# Patient Record
Sex: Male | Born: 1940
Health system: Southern US, Community
[De-identification: ages and names within clinical notes are randomized; demographics above are authoritative.]

## PROBLEM LIST (undated history)

## (undated) DIAGNOSIS — H9190 Unspecified hearing loss, unspecified ear: Secondary | ICD-10-CM

## (undated) DIAGNOSIS — E7800 Pure hypercholesterolemia, unspecified: Secondary | ICD-10-CM

## (undated) DIAGNOSIS — R52 Pain, unspecified: Secondary | ICD-10-CM

## (undated) DIAGNOSIS — C61 Malignant neoplasm of prostate: Secondary | ICD-10-CM

## (undated) DIAGNOSIS — E039 Hypothyroidism, unspecified: Secondary | ICD-10-CM

## (undated) DIAGNOSIS — C801 Malignant (primary) neoplasm, unspecified: Secondary | ICD-10-CM

## (undated) DIAGNOSIS — H53419 Scotoma involving central area, unspecified eye: Secondary | ICD-10-CM

## (undated) DIAGNOSIS — I10 Essential (primary) hypertension: Secondary | ICD-10-CM

## (undated) HISTORY — PX: HAND SURGERY: SHX662

## (undated) HISTORY — PX: OTHER SURGICAL HISTORY: SHX169

---

## 1961-12-05 HISTORY — PX: APPENDECTOMY: SHX54

## 2000-11-02 ENCOUNTER — Ambulatory Visit (HOSPITAL_COMMUNITY): Admission: RE | Admit: 2000-11-02 | Discharge: 2000-11-02 | Payer: Self-pay | Admitting: Orthopedic Surgery

## 2000-11-02 ENCOUNTER — Encounter: Payer: Self-pay | Admitting: Orthopedic Surgery

## 2014-01-08 DIAGNOSIS — C61 Malignant neoplasm of prostate: Secondary | ICD-10-CM

## 2014-01-08 HISTORY — DX: Malignant neoplasm of prostate: C61

## 2014-02-06 ENCOUNTER — Other Ambulatory Visit: Payer: Self-pay | Admitting: Urology

## 2014-02-12 ENCOUNTER — Encounter (HOSPITAL_COMMUNITY): Payer: Self-pay | Admitting: Pharmacy Technician

## 2014-02-14 ENCOUNTER — Encounter (HOSPITAL_COMMUNITY): Payer: Self-pay

## 2014-02-14 ENCOUNTER — Ambulatory Visit (HOSPITAL_COMMUNITY)
Admission: RE | Admit: 2014-02-14 | Discharge: 2014-02-14 | Disposition: A | Discharge status: Home / Self Care | Payer: Medicare FFS | Source: Ambulatory Visit | Attending: Urology | Admitting: Urology

## 2014-02-14 ENCOUNTER — Encounter (INDEPENDENT_AMBULATORY_CARE_PROVIDER_SITE_OTHER): Payer: Self-pay

## 2014-02-14 ENCOUNTER — Encounter (HOSPITAL_COMMUNITY)
Admission: RE | Admit: 2014-02-14 | Discharge: 2014-02-14 | Disposition: A | Discharge status: Home / Self Care | Payer: Medicare FFS | Source: Ambulatory Visit | Attending: Urology | Admitting: Urology

## 2014-02-14 DIAGNOSIS — Z01812 Encounter for preprocedural laboratory examination: Secondary | ICD-10-CM | POA: Insufficient documentation

## 2014-02-14 DIAGNOSIS — I10 Essential (primary) hypertension: Secondary | ICD-10-CM | POA: Insufficient documentation

## 2014-02-14 DIAGNOSIS — Z01818 Encounter for other preprocedural examination: Secondary | ICD-10-CM | POA: Insufficient documentation

## 2014-02-14 DIAGNOSIS — Z87891 Personal history of nicotine dependence: Secondary | ICD-10-CM | POA: Insufficient documentation

## 2014-02-14 DIAGNOSIS — Z01810 Encounter for preprocedural cardiovascular examination: Secondary | ICD-10-CM | POA: Insufficient documentation

## 2014-02-14 HISTORY — DX: Essential (primary) hypertension: I10

## 2014-02-14 HISTORY — DX: Malignant (primary) neoplasm, unspecified: C80.1

## 2014-02-14 HISTORY — DX: Scotoma involving central area, unspecified eye: H53.419

## 2014-02-14 HISTORY — DX: Pain, unspecified: R52

## 2014-02-14 HISTORY — DX: Hypothyroidism, unspecified: E03.9

## 2014-02-14 HISTORY — DX: Pure hypercholesterolemia, unspecified: E78.00

## 2014-02-14 HISTORY — DX: Unspecified hearing loss, unspecified ear: H91.90

## 2014-02-14 LAB — BASIC METABOLIC PANEL
BUN: 17 mg/dL (ref 6–23)
CALCIUM: 9.5 mg/dL (ref 8.4–10.5)
CHLORIDE: 102 meq/L (ref 96–112)
CO2: 25 mEq/L (ref 19–32)
CREATININE: 1.05 mg/dL (ref 0.50–1.35)
GFR calc non Af Amer: 69 mL/min — ABNORMAL LOW (ref >90)
GFR, EST AFRICAN AMERICAN: 80 mL/min — AB (ref >90)
Glucose, Bld: 91 mg/dL (ref 70–99)
Potassium: 4 mEq/L (ref 3.7–5.3)
Sodium: 140 mEq/L (ref 137–147)

## 2014-02-14 LAB — CBC
HEMATOCRIT: 39.2 % (ref 39.0–52.0)
Hemoglobin: 13.4 g/dL (ref 13.0–17.0)
MCH: 29.3 pg (ref 26.0–34.0)
MCHC: 34.2 g/dL (ref 30.0–36.0)
MCV: 85.8 fL (ref 78.0–100.0)
PLATELETS: 190 10*3/uL (ref 150–400)
RBC: 4.57 MIL/uL (ref 4.22–5.81)
RDW: 13.4 % (ref 11.5–15.5)
WBC: 7.2 10*3/uL (ref 4.0–10.5)

## 2014-02-14 LAB — ABO/RH: ABO/RH(D): A POS

## 2014-02-14 NOTE — Progress Notes (Signed)
02/14/14 0900  OBSTRUCTIVE SLEEP APNEA  Have you ever been diagnosed with sleep apnea through a sleep study? No  Do you snore loudly (loud enough to be heard through closed doors)?  1  Do you often feel tired, fatigued, or sleepy during the daytime? 0  Has anyone observed you stop breathing during your sleep? 0  Do you have, or are you being treated for high blood pressure? 1  BMI more than 35 kg/m2? 0  Age over 72 years old? 1  Neck circumference greater than 40 cm/18 inches? 1  Gender: 1  Obstructive Sleep Apnea Score 5  Score 4 or greater  Results sent to PCP

## 2014-02-14 NOTE — Pre-Procedure Instructions (Signed)
EKG AND CXR WERE DONE TODAY - PREOP AT [facility]. 

## 2014-02-14 NOTE — Patient Instructions (Signed)
FOLLOW YOUR BOWEL PREP INSTRUCTIONS DAY BEFORE YOUR SURGERY.  YOUR SURGERY IS SCHEDULED AT [facility]  ON:   Thursday  3/19  REPORT TO  SHORT STAY CENTER AT:  5:15 AM      PHONE # FOR SHORT STAY IS [phone]  DO NOT EAT OR DRINK ANYTHING AFTER MIDNIGHT THE NIGHT BEFORE YOUR SURGERY.  YOU MAY BRUSH YOUR TEETH, RINSE OUT YOUR MOUTH--BUT NO WATER, NO FOOD, NO CHEWING GUM, NO MINTS, NO CANDIES, NO CHEWING TOBACCO.  PLEASE TAKE THE FOLLOWING MEDICATIONS THE AM OF YOUR SURGERY WITH A FEW SIPS OF WATER:  LEVOTHYROXINE  DO NOT BRING VALUABLES, MONEY, CREDIT CARDS.  DO NOT WEAR JEWELRY, MAKE-UP, NAIL POLISH AND NO METAL PINS OR CLIPS IN YOUR HAIR. CONTACT LENS, DENTURES / PARTIALS, GLASSES SHOULD NOT BE WORN TO SURGERY AND IN MOST CASES-HEARING AIDS WILL NEED TO BE REMOVED.  BRING YOUR GLASSES CASE, ANY EQUIPMENT NEEDED FOR YOUR CONTACT LENS. FOR PATIENTS ADMITTED TO THE HOSPITAL--CHECK OUT TIME THE DAY OF DISCHARGE IS 11:00 AM.  ALL INPATIENT ROOMS ARE PRIVATE - WITH BATHROOM, TELEPHONE, TELEVISION AND WIFI INTERNET.                                                    PLEASE READ OVER ANY  FACT SHEETS THAT YOU WERE GIVEN:  INCENTIVE SPIROMETER INFORMATION.  FAILURE TO FOLLOW THESE INSTRUCTIONS MAY RESULT IN THE CANCELLATION OF YOUR SURGERY. PLEASE BE AWARE THAT YOU MAY NEED ADDITIONAL BLOOD DRAWN DAY OF YOUR SURGERY  PATIENT SIGNATURE_________________________________

## 2014-02-19 ENCOUNTER — Encounter (HOSPITAL_COMMUNITY): Payer: Self-pay | Admitting: Anesthesiology

## 2014-02-19 NOTE — H&P (Signed)
Chief Complaint Prostate Cancer   Reason For Visit Reason for consult: To discuss treatment options for prostate cancer and specifically to consider a robotic prostatectomy.  Physician requesting consult: Dr. Franchot Gallo  PCP: Dr. Antony Contras   History of Present Illness Douglas Nielsen is a 73 year old gentleman who was noted to have an elevated PSA of 5.12. This prompted urologic evaluation and a prostate needle biopsy on 01/08/14 confirming Gleason 4+3=7 adenocarcinoma of the prostate with 5 out of 12 biopsy cores. He does have a paternal family history of prostate cancer. He is exceedingly healthy aside from hypertension, dyslipidemia, and hypothyroidism.    TNM stage: cT1c Nx Mx  PSA: 5.12  Gleason score: 4+3=7  Biopsy (01/08/14): 5/12 cores positive    Left: L lateral apex (5%, 3+3=6), L lateral base (5%, 3+3=6)    Right: R apex (< 5%, 3+3=6), R base (80%, 4+3=7), R lateral base (10%, 4+3=7)  Prostate volume: 24.5 cc    Nomogram  OC disease: 47%  EPE: 47%  SVI: 11%  LNI: 11%  PFS (surgery): 70% at 5 years, 55% at 10 years    Urinary function: He has minimal lower urinary tract symptoms. IPSS is 3.  Erectile function: He had his wife are not currently sexually active. He does have pre-existing erectile dysfunction. SHIM score is 5.   Past Medical History Problems  1. History of hypercholesterolemia (V12.29) 2. History of hypertension (V12.59) 3. History of hypothyroidism (V12.29)  Surgical History Problems  1. History of Appendectomy 2. History of Hand Surgery  Current Meds 1. Daily Multiple Vitamins TABS;  Therapy: (Recorded:04Mar2015) to Recorded 2. Fish Oil CAPS;  Therapy: (Recorded:04Mar2015) to Recorded 3. Levothyroxine Sodium 150 MCG Oral Tablet;  Therapy: (Recorded:20Jul2012) to Recorded 4. Levothyroxine Sodium 175 MCG Oral Tablet;  Therapy: 06Sep2013 to Recorded 5. Losartan Potassium 25 MG Oral Tablet;  Therapy: (FXTKWIOX:[id number])  to Recorded 6. Simvastatin 40 MG Oral Tablet;  Therapy: (Recorded:20Jul2012) to Recorded  Allergies Medication  1. No Known Drug Allergies  Family History Problems  1. Family history of Family Health Status Number Of Children   2 daughters 2. Family history of Father Deceased At Age 55   CHF 3. Family history of Heart Disease (V17.49) : Father 4. Family history of Hypothyroidism : Sister 42. Family history of Mother Deceased At Age 101   multiple myeloma 6. Family history of Multiple Myeloma : Mother 7. Family history of Prostate Cancer (Q68.34) : Father  Social History Problems    Alcohol Use   3 per day   Former smoker (V15.82)   1 ppd x 4yrs, quit 11yrs ago   Marital History - Currently Married   Occupation: Retired  Review of Systems  Genitourinary: no hematuria.  Constitutional: no recent weight loss.    Vitals Vital Signs [Data Includes: Last 1 Day]  Recorded: [id number] 10:11AM  Blood Pressure: 166 / 84 Heart Rate: 64  Physical Exam Constitutional: Well nourished and well developed . No acute distress.  ENT:. The ears and nose are normal in appearance.  Neck: The appearance of the neck is normal and no neck mass is present.  Pulmonary: No respiratory distress, normal respiratory rhythm and effort and clear bilateral breath sounds.  Cardiovascular: Heart rate and rhythm are normal . No peripheral edema.  Abdomen: right lower quadrant incision site(s) well healed. The abdomen is soft and nontender. No masses are palpated. No CVA tenderness. No hernias are palpable. No hepatosplenomegaly noted.  Rectal: Prostate size is estimated to  be 30 g. The prostate has no nodularity and is not indurated.  Genitourinary: Examination of the penis demonstrates no discharge, no masses, no lesions and a normal meatus. The scrotum is without lesions. The right epididymis is palpably normal and non-tender. The left epididymis is palpably normal and non-tender. The right  testis is non-tender and without masses. The left testis is non-tender and without masses.  Lymphatics: The femoral and inguinal nodes are not enlarged or tender.  Skin: Normal skin turgor, no visible rash and no visible skin lesions.  Neuro/Psych:. Mood and affect are appropriate.    Results/Data Urine [Data Includes: Last 1 Day]   [id number]  COLOR YELLOW   APPEARANCE CLEAR   SPECIFIC GRAVITY 1.020   pH 6.0   GLUCOSE NEG mg/dL  BILIRUBIN NEG   KETONE NEG mg/dL  BLOOD NEG   PROTEIN NEG mg/dL  UROBILINOGEN 0.2 mg/dL  NITRITE NEG   LEUKOCYTE ESTERASE NEG    I have reviewed his medical records, PSA results, and pathology report. Findings are as dictated above.   Plan  Adenocarcinoma of prostate  1. Follow-up Keep Future Appt Office  Follow-up  Status: Complete  Done: [id number] Health Maintenance  2. UA With REFLEX; [Do Not Release]; Status:Complete;   Done: [id number] 10:02AM  Adenocarcinoma of prostate (185) (C61)   Discussion/Summary 1. Prostate cancer: I had a detailed discussion with Mr. Kall and his wife today regarding his treatment options for prostate cancer. He has elected to proceed with surgical treatment.   The patient was counseled about the natural history of prostate cancer and the standard treatment options that are available for prostate cancer. It was explained to him how his age and life expectancy, clinical stage, Gleason score, and PSA affect his prognosis, the decision to proceed with additional staging studies, as well as how that information influences recommended treatment strategies. We discussed the roles for active surveillance, radiation therapy, surgical therapy, androgen deprivation, as well as ablative therapy options for the treatment of prostate cancer as appropriate to his individual cancer situation. We discussed the risks and benefits of these options with regard to their impact on cancer control and also in terms of potential adverse events,  complications, and impact on quiality of life particularly related to urinary, bowel, and sexual function. The patient was encouraged to ask questions throughout the discussion today and all questions were answered to his stated satisfaction. In addition, the patient was provided with and/or directed to appropriate resources and literature for further education about prostate cancer and treatment options.   We discussed surgical therapy for prostate cancer including the different available surgical approaches. We discussed, in detail, the risks and expectations of surgery with regard to cancer control, urinary control, and erectile function as well as the expected postoperative recovery process. Additional risks of surgery including but not limited to bleeding, infection, hernia formation, nerve damage, lymphocele formation, bowel/rectal injury potentially necessitating colostomy, damage to the urinary tract resulting in urine leakage, urethral stricture, and the cardiopulmonary risks such as myocardial infarction, stroke, death, venothromboembolism, etc. were explained. The risk of open surgical conversion for robotic/laparoscopic prostatectomy was also discussed.     He feels well informed and is ready to proceed with surgery. He will be scheduled for a bilateral nerve sparing (although attention will be paid to the right side of the prostate) robotic-assisted left scopic radical prostatectomy and bilateral pelvic lymphadenectomy.    Cc: Dr. Franchot Gallo  Dr. Antony Contras    SignaturesElectronically signed by :  Raynelle Bring, M.D.; Feb 14 2014 11:31AM EST

## 2014-02-19 NOTE — Anesthesia Preprocedure Evaluation (Addendum)
Anesthesia Evaluation  Patient identified by MRN, date of birth, ID band Patient awake    Reviewed: Allergy & Precautions, H&P , NPO status , Patient's Chart, lab work & pertinent test results  Airway Mallampati: II TM Distance: >3 FB Neck ROM: Full    Dental no notable dental hx.    Pulmonary former smoker,  He says he quit smoking in 1987 breath sounds clear to auscultation  Pulmonary exam normal       Cardiovascular hypertension, Pt. on medications Rhythm:Regular Rate:Normal  CXR and ECG reviewed.   Neuro/Psych Loss of some vision in right eye. negative psych ROS   GI/Hepatic negative GI ROS, Neg liver ROS,   Endo/Other  Hypothyroidism   Renal/GU negative Renal ROS  negative genitourinary   Musculoskeletal negative musculoskeletal ROS (+)   Abdominal (+) + obese,   Peds negative pediatric ROS (+)  Hematology negative hematology ROS (+)   Anesthesia Other Findings   Reproductive/Obstetrics negative OB ROS                        Anesthesia Physical Anesthesia Plan  ASA: II  Anesthesia Plan: General   Post-op Pain Management:    Induction: Intravenous  Airway Management Planned: Oral ETT  Additional Equipment:   Intra-op Plan:   Post-operative Plan: Extubation in OR  Informed Consent: I have reviewed the patients History and Physical, chart, labs and discussed the procedure including the risks, benefits and alternatives for the proposed anesthesia with the patient or authorized representative who has indicated his/her understanding and acceptance.   Dental advisory given  Plan Discussed with: CRNA  Anesthesia Plan Comments:         Anesthesia Quick Evaluation

## 2014-02-20 ENCOUNTER — Encounter (HOSPITAL_COMMUNITY): Payer: Medicare FFS | Admitting: Anesthesiology

## 2014-02-20 ENCOUNTER — Encounter (HOSPITAL_COMMUNITY): Payer: Self-pay | Admitting: *Deleted

## 2014-02-20 ENCOUNTER — Inpatient Hospital Stay (HOSPITAL_COMMUNITY): Payer: Medicare FFS | Admitting: Anesthesiology

## 2014-02-20 ENCOUNTER — Encounter (HOSPITAL_COMMUNITY)
Admission: RE | Disposition: A | Discharge status: Home / Self Care | Payer: Self-pay | Source: Ambulatory Visit | Attending: Urology

## 2014-02-20 ENCOUNTER — Inpatient Hospital Stay (HOSPITAL_COMMUNITY)
Admission: RE | Admit: 2014-02-20 | Discharge: 2014-02-21 | DRG: 708 | Disposition: A | Discharge status: Home / Self Care | Payer: Medicare FFS | Source: Ambulatory Visit | Attending: Urology | Admitting: Urology

## 2014-02-20 DIAGNOSIS — Z87891 Personal history of nicotine dependence: Secondary | ICD-10-CM

## 2014-02-20 DIAGNOSIS — E785 Hyperlipidemia, unspecified: Secondary | ICD-10-CM | POA: Diagnosis present

## 2014-02-20 DIAGNOSIS — Z79899 Other long term (current) drug therapy: Secondary | ICD-10-CM

## 2014-02-20 DIAGNOSIS — E039 Hypothyroidism, unspecified: Secondary | ICD-10-CM | POA: Diagnosis present

## 2014-02-20 DIAGNOSIS — I10 Essential (primary) hypertension: Secondary | ICD-10-CM | POA: Diagnosis present

## 2014-02-20 DIAGNOSIS — C61 Malignant neoplasm of prostate: Secondary | ICD-10-CM | POA: Diagnosis present

## 2014-02-20 DIAGNOSIS — Z8249 Family history of ischemic heart disease and other diseases of the circulatory system: Secondary | ICD-10-CM

## 2014-02-20 DIAGNOSIS — Z807 Family history of other malignant neoplasms of lymphoid, hematopoietic and related tissues: Secondary | ICD-10-CM

## 2014-02-20 DIAGNOSIS — Z8042 Family history of malignant neoplasm of prostate: Secondary | ICD-10-CM

## 2014-02-20 DIAGNOSIS — E7800 Pure hypercholesterolemia, unspecified: Secondary | ICD-10-CM | POA: Diagnosis present

## 2014-02-20 HISTORY — PX: ROBOT ASSISTED LAPAROSCOPIC RADICAL PROSTATECTOMY: SHX5141

## 2014-02-20 HISTORY — PX: LYMPHADENECTOMY: SHX5960

## 2014-02-20 LAB — TYPE AND SCREEN
ABO/RH(D): A POS
Antibody Screen: NEGATIVE

## 2014-02-20 LAB — HEMOGLOBIN AND HEMATOCRIT, BLOOD
HEMATOCRIT: 35.5 % — AB (ref 39.0–52.0)
HEMOGLOBIN: 12 g/dL — AB (ref 13.0–17.0)

## 2014-02-20 SURGERY — ROBOTIC ASSISTED LAPAROSCOPIC RADICAL PROSTATECTOMY LEVEL 2
Anesthesia: General | Site: Prostate

## 2014-02-20 MED ORDER — LACTATED RINGERS IV SOLN: INTRAVENOUS | Status: DC

## 2014-02-20 MED ORDER — SUCCINYLCHOLINE CHLORIDE 20 MG/ML IJ SOLN
INTRAMUSCULAR | Status: DC | PRN
  Administered 2014-02-20: 100 mg via INTRAVENOUS

## 2014-02-20 MED ORDER — CIPROFLOXACIN HCL 500 MG PO TABS: 500.0000 mg | ORAL_TABLET | Freq: Two times a day (BID) | ORAL | Status: DC

## 2014-02-20 MED ORDER — KETOROLAC TROMETHAMINE 15 MG/ML IJ SOLN
INTRAMUSCULAR | Status: AC
  Filled 2014-02-20: qty 1

## 2014-02-20 MED ORDER — LACTATED RINGERS IV SOLN
INTRAVENOUS | Status: DC | PRN
  Administered 2014-02-20: 08:00:00

## 2014-02-20 MED ORDER — HEPARIN SODIUM (PORCINE) 1000 UNIT/ML IJ SOLN
INTRAMUSCULAR | Status: AC
  Filled 2014-02-20: qty 1

## 2014-02-20 MED ORDER — EPHEDRINE SULFATE 50 MG/ML IJ SOLN
INTRAMUSCULAR | Status: DC | PRN
  Administered 2014-02-20 (×2): 5 mg via INTRAVENOUS

## 2014-02-20 MED ORDER — HYDROCODONE-ACETAMINOPHEN 5-325 MG PO TABS: 1.0000 | ORAL_TABLET | Freq: Four times a day (QID) | ORAL | Status: DC | PRN

## 2014-02-20 MED ORDER — BUPIVACAINE-EPINEPHRINE PF 0.25-1:200000 % IJ SOLN
INTRAMUSCULAR | Status: AC
  Filled 2014-02-20: qty 30

## 2014-02-20 MED ORDER — ONDANSETRON HCL 4 MG/2ML IJ SOLN
INTRAMUSCULAR | Status: AC
  Filled 2014-02-20: qty 2

## 2014-02-20 MED ORDER — LACTATED RINGERS IV SOLN
INTRAVENOUS | Status: DC | PRN
  Administered 2014-02-20 (×2): via INTRAVENOUS

## 2014-02-20 MED ORDER — LIDOCAINE HCL (CARDIAC) 20 MG/ML IV SOLN
INTRAVENOUS | Status: AC
Start: 2014-02-20 — End: 2014-02-20
  Filled 2014-02-20: qty 5

## 2014-02-20 MED ORDER — KCL IN DEXTROSE-NACL 20-5-0.45 MEQ/L-%-% IV SOLN
INTRAVENOUS | Status: DC
  Administered 2014-02-20 (×3): via INTRAVENOUS
  Filled 2014-02-20 (×5): qty 1000

## 2014-02-20 MED ORDER — SODIUM CHLORIDE 0.9 % IR SOLN
Status: DC | PRN
  Administered 2014-02-20: 1000 mL via INTRAVESICAL

## 2014-02-20 MED ORDER — MIDAZOLAM HCL 5 MG/5ML IJ SOLN
INTRAMUSCULAR | Status: DC | PRN
  Administered 2014-02-20: 2 mg via INTRAVENOUS

## 2014-02-20 MED ORDER — DOCUSATE SODIUM 100 MG PO CAPS
100.0000 mg | ORAL_CAPSULE | Freq: Two times a day (BID) | ORAL | Status: DC
  Administered 2014-02-20 – 2014-02-21 (×2): 100 mg via ORAL
  Filled 2014-02-20 (×3): qty 1

## 2014-02-20 MED ORDER — SIMVASTATIN 40 MG PO TABS
40.0000 mg | ORAL_TABLET | Freq: Every evening | ORAL | Status: DC
  Administered 2014-02-20: 40 mg via ORAL
  Filled 2014-02-20 (×2): qty 1

## 2014-02-20 MED ORDER — ONDANSETRON HCL 4 MG/2ML IJ SOLN
INTRAMUSCULAR | Status: DC | PRN
  Administered 2014-02-20: 4 mg via INTRAVENOUS

## 2014-02-20 MED ORDER — MORPHINE SULFATE 2 MG/ML IJ SOLN
2.0000 mg | INTRAMUSCULAR | Status: DC | PRN
  Administered 2014-02-20: 2 mg via INTRAVENOUS
  Filled 2014-02-20: qty 1

## 2014-02-20 MED ORDER — STERILE WATER FOR IRRIGATION IR SOLN
Status: DC | PRN
  Administered 2014-02-20: 3000 mL

## 2014-02-20 MED ORDER — SODIUM CHLORIDE 0.9 % IV BOLUS (SEPSIS)
1000.0000 mL | Freq: Once | INTRAVENOUS | Status: AC
  Administered 2014-02-20: 1000 mL via INTRAVENOUS

## 2014-02-20 MED ORDER — NEOSTIGMINE METHYLSULFATE 1 MG/ML IJ SOLN
INTRAMUSCULAR | Status: AC
  Filled 2014-02-20: qty 10

## 2014-02-20 MED ORDER — LEVOTHYROXINE SODIUM 150 MCG PO TABS
150.0000 ug | ORAL_TABLET | ORAL | Status: DC
  Filled 2014-02-20: qty 1

## 2014-02-20 MED ORDER — PROPOFOL 10 MG/ML IV BOLUS
INTRAVENOUS | Status: DC | PRN
  Administered 2014-02-20: 200 mg via INTRAVENOUS

## 2014-02-20 MED ORDER — ZOLPIDEM TARTRATE 5 MG PO TABS
5.0000 mg | ORAL_TABLET | Freq: Every evening | ORAL | Status: DC | PRN
  Administered 2014-02-20: 5 mg via ORAL
  Filled 2014-02-20: qty 1

## 2014-02-20 MED ORDER — MIDAZOLAM HCL 2 MG/2ML IJ SOLN
INTRAMUSCULAR | Status: AC
  Filled 2014-02-20: qty 2

## 2014-02-20 MED ORDER — CEFAZOLIN SODIUM-DEXTROSE 2-3 GM-% IV SOLR
2.0000 g | INTRAVENOUS | Status: AC
  Administered 2014-02-20: 2 g via INTRAVENOUS

## 2014-02-20 MED ORDER — CEFAZOLIN SODIUM-DEXTROSE 2-3 GM-% IV SOLR
INTRAVENOUS | Status: AC
  Filled 2014-02-20: qty 50

## 2014-02-20 MED ORDER — KETOROLAC TROMETHAMINE 15 MG/ML IJ SOLN
15.0000 mg | Freq: Four times a day (QID) | INTRAMUSCULAR | Status: DC
  Administered 2014-02-20 – 2014-02-21 (×4): 15 mg via INTRAVENOUS
  Filled 2014-02-20 (×5): qty 1

## 2014-02-20 MED ORDER — LIDOCAINE HCL (PF) 2 % IJ SOLN
INTRAMUSCULAR | Status: DC | PRN
  Administered 2014-02-20: 75 mg via INTRADERMAL

## 2014-02-20 MED ORDER — ACETAMINOPHEN 325 MG PO TABS
650.0000 mg | ORAL_TABLET | ORAL | Status: DC | PRN
  Filled 2014-02-20: qty 2

## 2014-02-20 MED ORDER — CISATRACURIUM BESYLATE (PF) 10 MG/5ML IV SOLN
INTRAVENOUS | Status: DC | PRN
  Administered 2014-02-20: 2 mg via INTRAVENOUS
  Administered 2014-02-20: 4 mg via INTRAVENOUS
  Administered 2014-02-20: 2 mg via INTRAVENOUS
  Administered 2014-02-20: 10 mg via INTRAVENOUS
  Administered 2014-02-20: 4 mg via INTRAVENOUS

## 2014-02-20 MED ORDER — FENTANYL CITRATE 0.05 MG/ML IJ SOLN
INTRAMUSCULAR | Status: AC
  Filled 2014-02-20: qty 5

## 2014-02-20 MED ORDER — PROPOFOL 10 MG/ML IV BOLUS
INTRAVENOUS | Status: AC
  Filled 2014-02-20: qty 20

## 2014-02-20 MED ORDER — LEVOTHYROXINE SODIUM 175 MCG PO TABS
175.0000 ug | ORAL_TABLET | ORAL | Status: DC
  Administered 2014-02-21: 175 ug via ORAL
  Filled 2014-02-20: qty 1

## 2014-02-20 MED ORDER — DIPHENHYDRAMINE HCL 12.5 MG/5ML PO ELIX: 12.5000 mg | ORAL_SOLUTION | Freq: Four times a day (QID) | ORAL | Status: DC | PRN

## 2014-02-20 MED ORDER — GLYCOPYRROLATE 0.2 MG/ML IJ SOLN
INTRAMUSCULAR | Status: AC
  Filled 2014-02-20: qty 3

## 2014-02-20 MED ORDER — GLYCOPYRROLATE 0.2 MG/ML IJ SOLN
INTRAMUSCULAR | Status: DC | PRN
  Administered 2014-02-20: 0.4 mg via INTRAVENOUS

## 2014-02-20 MED ORDER — HYDROMORPHONE HCL PF 1 MG/ML IJ SOLN
0.2500 mg | INTRAMUSCULAR | Status: DC | PRN
  Administered 2014-02-20 (×3): 0.25 mg via INTRAVENOUS

## 2014-02-20 MED ORDER — NEOSTIGMINE METHYLSULFATE 1 MG/ML IJ SOLN
INTRAMUSCULAR | Status: DC | PRN
  Administered 2014-02-20: 4 mg via INTRAVENOUS

## 2014-02-20 MED ORDER — CISATRACURIUM BESYLATE 20 MG/10ML IV SOLN
INTRAVENOUS | Status: AC
  Filled 2014-02-20: qty 10

## 2014-02-20 MED ORDER — DIPHENHYDRAMINE HCL 50 MG/ML IJ SOLN: 12.5000 mg | Freq: Four times a day (QID) | INTRAMUSCULAR | Status: DC | PRN

## 2014-02-20 MED ORDER — LACTATED RINGERS IV BOLUS (SEPSIS)
250.0000 mL | Freq: Once | INTRAVENOUS | Status: AC
  Administered 2014-02-20: 250 mL via INTRAVENOUS

## 2014-02-20 MED ORDER — EPHEDRINE SULFATE 50 MG/ML IJ SOLN
INTRAMUSCULAR | Status: AC
  Filled 2014-02-20: qty 1

## 2014-02-20 MED ORDER — HYDROMORPHONE HCL PF 1 MG/ML IJ SOLN
INTRAMUSCULAR | Status: AC
  Filled 2014-02-20: qty 1

## 2014-02-20 MED ORDER — CEFAZOLIN SODIUM 1-5 GM-% IV SOLN
1.0000 g | Freq: Three times a day (TID) | INTRAVENOUS | Status: AC
  Administered 2014-02-20 (×2): 1 g via INTRAVENOUS
  Filled 2014-02-20 (×3): qty 50

## 2014-02-20 MED ORDER — PROMETHAZINE HCL 25 MG/ML IJ SOLN: 6.2500 mg | INTRAMUSCULAR | Status: DC | PRN

## 2014-02-20 MED ORDER — SODIUM CHLORIDE 0.9 % IJ SOLN
INTRAMUSCULAR | Status: AC
  Filled 2014-02-20: qty 10

## 2014-02-20 MED ORDER — BUPIVACAINE-EPINEPHRINE 0.25% -1:200000 IJ SOLN
INTRAMUSCULAR | Status: DC | PRN
  Administered 2014-02-20: 30 mL

## 2014-02-20 MED ORDER — ATROPINE SULFATE 0.4 MG/ML IJ SOLN
INTRAMUSCULAR | Status: AC
Start: 2014-02-20 — End: 2014-02-20
  Filled 2014-02-20: qty 1

## 2014-02-20 MED ORDER — FENTANYL CITRATE 0.05 MG/ML IJ SOLN
INTRAMUSCULAR | Status: DC | PRN
Start: 2014-02-20 — End: 2014-02-20
  Administered 2014-02-20: 100 ug via INTRAVENOUS
  Administered 2014-02-20 (×3): 50 ug via INTRAVENOUS

## 2014-02-20 SURGICAL SUPPLY — 49 items
CABLE HIGH FREQUENCY MONO STRZ (ELECTRODE) ×3 IMPLANT
CANNULA SEAL DVNC (CANNULA) ×6 IMPLANT
CANNULA SEALS DA VINCI (CANNULA) ×3
CATH FOLEY 2WAY SLVR 18FR 30CC (CATHETERS) ×3 IMPLANT
CATH ROBINSON RED A/P 16FR (CATHETERS) ×3 IMPLANT
CATH ROBINSON RED A/P 8FR (CATHETERS) ×3 IMPLANT
CATH TIEMANN FOLEY 18FR 5CC (CATHETERS) ×3 IMPLANT
CHLORAPREP W/TINT 26ML (MISCELLANEOUS) ×3 IMPLANT
CLIP LIGATING HEM O LOK PURPLE (MISCELLANEOUS) ×6 IMPLANT
CLOTH BEACON ORANGE TIMEOUT ST (SAFETY) ×3 IMPLANT
COVER SURGICAL LIGHT HANDLE (MISCELLANEOUS) ×3 IMPLANT
COVER TIP SHEARS 8 DVNC (MISCELLANEOUS) ×2 IMPLANT
COVER TIP SHEARS 8MM DA VINCI (MISCELLANEOUS) ×1
CUTTER ECHEON FLEX ENDO 45 340 (ENDOMECHANICALS) ×3 IMPLANT
DECANTER SPIKE VIAL GLASS SM (MISCELLANEOUS) IMPLANT
DERMABOND ADVANCED (GAUZE/BANDAGES/DRESSINGS) ×1
DERMABOND ADVANCED .7 DNX12 (GAUZE/BANDAGES/DRESSINGS) ×2 IMPLANT
DRAPE CAMERA ARM DAVINCI SIROB (DRAPES) ×3 IMPLANT
DRAPE CAMERA HEAD DAVINCI SI (DRAPES) ×3 IMPLANT
DRAPE INSTRUMENT ARM DA VINCI (DRAPES) ×3
DRAPE INSTRUMENT ARM DVNC (DRAPES) ×6 IMPLANT
DRAPE SURG IRRIG POUCH 19X23 (DRAPES) ×3 IMPLANT
DRSG TEGADERM 4X4.75 (GAUZE/BANDAGES/DRESSINGS) ×3 IMPLANT
DRSG TEGADERM 6X8 (GAUZE/BANDAGES/DRESSINGS) ×9 IMPLANT
ELECT REM PT RETURN 9FT ADLT (ELECTROSURGICAL) ×3
ELECTRODE REM PT RTRN 9FT ADLT (ELECTROSURGICAL) ×2 IMPLANT
GLOVE BIO SURGEON STRL SZ 6.5 (GLOVE) ×9 IMPLANT
GLOVE BIOGEL M STRL SZ7.5 (GLOVE) ×6 IMPLANT
GOWN STRL REUS W/TWL LRG LVL3 (GOWN DISPOSABLE) ×9 IMPLANT
GOWN STRL REUS W/TWL XL LVL3 (GOWN DISPOSABLE) ×6 IMPLANT
HOLDER FOLEY CATH W/STRAP (MISCELLANEOUS) ×3 IMPLANT
IV LACTATED RINGERS 1000ML (IV SOLUTION) ×3 IMPLANT
KIT ACCESSORY DA VINCI DISP (KITS) ×1
KIT ACCESSORY DVNC DISP (KITS) ×2 IMPLANT
MANIFOLD NEPTUNE II (INSTRUMENTS) ×3 IMPLANT
NDL SAFETY ECLIPSE 18X1.5 (NEEDLE) ×2 IMPLANT
NEEDLE HYPO 18GX1.5 SHARP (NEEDLE) ×1
PACK ROBOT UROLOGY CUSTOM (CUSTOM PROCEDURE TRAY) ×3 IMPLANT
RELOAD GREEN ECHELON 45 (STAPLE) ×3 IMPLANT
SET TUBE IRRIG SUCTION NO TIP (IRRIGATION / IRRIGATOR) ×3 IMPLANT
SOLUTION ELECTROLUBE (MISCELLANEOUS) ×3 IMPLANT
SPONGE GAUZE 4X4 12PLY (GAUZE/BANDAGES/DRESSINGS) ×3 IMPLANT
SUT ETHILON 3 0 PS 1 (SUTURE) ×3 IMPLANT
SUT MNCRL AB 4-0 PS2 18 (SUTURE) ×6 IMPLANT
SUT VICRYL 0 UR6 27IN ABS (SUTURE) ×6 IMPLANT
SYR 27GX1/2 1ML LL SAFETY (SYRINGE) ×3 IMPLANT
TOWEL OR 17X26 10 PK STRL BLUE (TOWEL DISPOSABLE) ×3 IMPLANT
TOWEL OR NON WOVEN STRL DISP B (DISPOSABLE) ×3 IMPLANT
WATER STERILE IRR 1500ML POUR (IV SOLUTION) ×6 IMPLANT

## 2014-02-20 NOTE — Transfer of Care (Signed)
Immediate Anesthesia Transfer of Care Note  Patient: Douglas Nielsen  Procedure(s) Performed: Procedure(s): ROBOTIC ASSISTED LAPAROSCOPIC RADICAL PROSTATECTOMY LEVEL 2 (N/A) LYMPHADENECTOMY (Bilateral)  Patient Location: PACU  Anesthesia Type:General  Level of Consciousness: awake, alert , oriented and patient cooperative  Airway & Oxygen Therapy: Patient Spontanous Breathing and Patient connected to face mask oxygen  Post-op Assessment: Report given to PACU RN, Post -op Vital signs reviewed and stable and Patient moving all extremities X 4  Post vital signs: Reviewed and stable  Complications: No apparent anesthesia complications

## 2014-02-20 NOTE — Progress Notes (Signed)
Utilization review completed.  

## 2014-02-20 NOTE — Interval H&P Note (Signed)
History and Physical Interval Note:  02/20/2014 7:07 AM  Douglas Nielsen  has presented today for surgery, with the diagnosis of Prostate Cancer  The various methods of treatment have been discussed with the patient and family. After consideration of risks, benefits and other options for treatment, the patient has consented to  Procedure(s): ROBOTIC ASSISTED LAPAROSCOPIC RADICAL PROSTATECTOMY LEVEL 2 (N/A) LYMPHADENECTOMY (Bilateral) as a surgical intervention .  The patient's history has been reviewed, patient examined, no change in status, stable for surgery.  I have reviewed the patient's chart and labs.  Questions were answered to the patient's satisfaction.     Sharry Beining,LES

## 2014-02-20 NOTE — Anesthesia Postprocedure Evaluation (Signed)
  Anesthesia Post-op Note  Patient: Douglas Nielsen  Procedure(s) Performed: Procedure(s) (LRB): ROBOTIC ASSISTED LAPAROSCOPIC RADICAL PROSTATECTOMY LEVEL 2 (N/A) LYMPHADENECTOMY (Bilateral)  Patient Location: PACU  Anesthesia Type: General  Level of Consciousness: awake and alert   Airway and Oxygen Therapy: Patient Spontanous Breathing  Post-op Pain: mild  Post-op Assessment: Post-op Vital signs reviewed, Patient's Cardiovascular Status Stable, Respiratory Function Stable, Patent Airway and No signs of Nausea or vomiting  Last Vitals:  Filed Vitals:   02/20/14 1423  BP: 119/72  Pulse: 63  Temp: 36.4 C  Resp: 12    Post-op Vital Signs: stable   Complications: No apparent anesthesia complications. Received fluid bolus for low blood pressure.

## 2014-02-20 NOTE — Op Note (Addendum)

## 2014-02-20 NOTE — Discharge Instructions (Signed)
1. Activity:  You are encouraged to ambulate frequently (about every hour during waking hours) to help prevent blood clots from forming in your legs or lungs.  However, you should not engage in any heavy lifting (> 10-15 lbs), strenuous activity, or straining. °2. Diet: You should continue a clear liquid diet until passing gas from below.  Once this occurs, you may advance your diet to a soft diet that would be easy to digest (i.e soups, scrambled eggs, mashed potatoes, etc.) for 24 hours just as you would if getting over a bad stomach flu.  If tolerating this diet well for 24 hours, you may then begin eating regular food.  It will be normal to have some amount of bloating, nausea, and abdominal discomfort intermittently. °3. Prescriptions:  You will be provided a prescription for pain medication to take as needed.  If your pain is not severe enough to require the prescription pain medication, you may take extra strength Tylenol instead.  You should also take an over the counter stool softener (Colace 100 mg twice daily) to avoid straining with bowel movements as the pain medication may constipate you. Finally, you will also be provided a prescription for an antibiotic to begin the day prior to your return visit in the office for catheter removal. °4. Catheter care: You will be taught how to take care of the catheter by the nursing staff prior to discharge from the hospital.  You may use both a leg bag and the larger bedside bag but it is recommended to at least use the bigger bedside bag at nighttime as the leg bag is small and will fill up overnight and also does not drain as well when lying flat. You may periodically feel a strong urge to void with the catheter in place.  This is a bladder spasm and most often can occur when having a bowel movement or when you are moving around. It is typically self-limited and usually will stop after a few minutes.  You may use some Vaseline or Neosporin around the tip of the  catheter to reduce friction at the tip of the penis. °5. Incisions: You may remove your dressing bandages the 2nd day after surgery.  You most likely will have a few small staples in each of the incisions and once the bandages are removed, the incisions may stay open to air.  You may start showering (not soaking or bathing in water) 48 hours after surgery and the incisions simply need to be patted dry after the shower.  No additional care is needed. °6. What to call us about: You should call the office ([phone]) if you develop fever > 101, persistent vomiting, or the catheter stops draining. Also, feel free to call with any other questions you may have and remember the handout that was provided to you as a reference preoperatively which answers many of the common questions that arise after surgery. ° °You may resume aspirin, vitamins, and supplements 7 days after surgery. °

## 2014-02-20 NOTE — Progress Notes (Signed)
Patient ID: Douglas Nielsen, male   DOB: [birth year], 73 y.o.   MRN: [id number] Post-op note  Subjective: The patient is doing well.  No complaints.  Denies N/V.    Objective: Vital signs in last 24 hours: Temp:  [96.8 F (36 C)-98.3 F (36.8 C)] 97.6 F (36.4 C) (03/19 1153) Pulse Rate:  [45-65] 49 (03/19 1115) Resp:  [8-25] 12 (03/19 1153) BP: (90-138)/(51-93) 90/65 mmHg (03/19 1153) SpO2:  [97 %-100 %] 100 % (03/19 1153) Weight:  [108.863 kg (240 lb)] 108.863 kg (240 lb) (03/19 0811)  Intake/Output from previous day:   Intake/Output this shift: Total I/O In: 1750 [I.V.:1750] Out: 285 [Urine:65; Drains:20; Blood:200]  Physical Exam:  General: Alert and oriented. Abdomen: Soft, Nondistended. Incisions: Clean and dry. Urine: red  Lab Results:  Recent Labs  02/20/14 1035  HGB 12.0*  HCT 35.5*    Assessment/Plan: POD#0   1) Continue to monitor  2) Monitor UO and BP.  He recently completed a 1 L fluid bolus and IVFs are running at 128ml/min.  If output does not improve or he becomes symptomatic with lower BP will rebolus IVF.  3) DVT prophy, clears, amb, IS, pain control     LOS: 0 days   YARBROUGH,Tien Spooner G. 02/20/2014, 1:47 PM

## 2014-02-21 LAB — HEMOGLOBIN AND HEMATOCRIT, BLOOD
HEMATOCRIT: 33.2 % — AB (ref 39.0–52.0)
Hemoglobin: 11.1 g/dL — ABNORMAL LOW (ref 13.0–17.0)

## 2014-02-21 MED ORDER — HYDROCODONE-ACETAMINOPHEN 5-325 MG PO TABS
1.0000 | ORAL_TABLET | Freq: Four times a day (QID) | ORAL | Status: DC | PRN
  Administered 2014-02-21: 1 via ORAL
  Filled 2014-02-21: qty 1

## 2014-02-21 MED ORDER — BISACODYL 10 MG RE SUPP
10.0000 mg | Freq: Once | RECTAL | Status: AC
  Administered 2014-02-21: 10 mg via RECTAL
  Filled 2014-02-21: qty 1

## 2014-02-21 NOTE — Discharge Summary (Signed)
  Date of admission: 02/20/2014  Date of discharge: 02/21/2014  Admission diagnosis: Prostate Cancer  Discharge diagnosis: Prostate Cancer  History and Physical: For full details, please see admission history and physical. Briefly, Douglas Nielsen is a 73 y.o. gentleman with localized prostate cancer.  After discussing management/treatment options, he elected to proceed with surgical treatment.  Hospital Course: Douglas Nielsen was taken to the operating room on 02/20/2014 and underwent a robotic assisted laparoscopic radical prostatectomy. He tolerated this procedure well and without complications. Postoperatively, he was able to be transferred to a regular hospital room following recovery from anesthesia.  He was able to begin ambulating the night of surgery. He remained hemodynamically stable overnight.  He had excellent urine output with appropriately minimal output from his pelvic drain and his pelvic drain was removed on POD #1.  He was transitioned to oral pain medication, tolerated a clear liquid diet, and had met all discharge criteria and was able to be discharged home later on POD#1.  Laboratory values:  Recent Labs  02/20/14 1035 02/21/14 0355  HGB 12.0* 11.1*  HCT 35.5* 33.2*    Disposition: Home  Discharge instruction: He was instructed to be ambulatory but to refrain from heavy lifting, strenuous activity, or driving. He was instructed on urethral catheter care.  Discharge medications:     Medication List    STOP taking these medications       multivitamin with minerals Tabs tablet     OVER THE COUNTER MEDICATION      TAKE these medications       acetaminophen 500 MG tablet  Commonly known as:  TYLENOL  Take 1,000 mg by mouth every 6 (six) hours as needed for mild pain or moderate pain.     ciprofloxacin 500 MG tablet  Commonly known as:  CIPRO  Take 1 tablet (500 mg total) by mouth 2 (two) times daily. Start day prior to office visit for foley removal     HYDROcodone-acetaminophen 5-325 MG per tablet  Commonly known as:  NORCO  Take 1-2 tablets by mouth every 6 (six) hours as needed.     levothyroxine 150 MCG tablet  Commonly known as:  SYNTHROID, LEVOTHROID  Take 150 mcg by mouth every other day. Alternates between 150 and 175     levothyroxine 175 MCG tablet  Commonly known as:  SYNTHROID, LEVOTHROID  Take 175 mcg by mouth every other day. Alternates between 150 and 175     losartan 25 MG tablet  Commonly known as:  COZAAR  Take 25 mg by mouth every evening.     phenylephrine 1 % nasal spray  Commonly known as:  NEO-SYNEPHRINE  Place 1 drop into both nostrils every 6 (six) hours as needed for congestion.     simvastatin 40 MG tablet  Commonly known as:  ZOCOR  Take 40 mg by mouth every evening.        Followup: He will followup in 1 week for catheter removal and to discuss his surgical pathology results.

## 2014-02-21 NOTE — Progress Notes (Signed)
Patient ID: Douglas Nielsen, male   DOB: [birth year], 73 y.o.   MRN: [id number]  1 Day Post-Op Subjective: The patient is doing well.  No nausea or vomiting. Pain is adequately controlled.  Objective: Vital signs in last 24 hours: Temp:  [96.8 F (36 C)-98.3 F (36.8 C)] 98 F (36.7 C) (03/20 0522) Pulse Rate:  [45-63] 59 (03/20 0522) Resp:  [8-25] 16 (03/20 0522) BP: (90-125)/(51-72) 118/66 mmHg (03/20 0522) SpO2:  [95 %-100 %] 97 % (03/20 0522) Weight:  [108.863 kg (240 lb)] 108.863 kg (240 lb) (03/19 0811)  Intake/Output from previous day: 03/19 0701 - 03/20 0700 In: 4560 [I.V.:4460; IV Piggyback:100] Out: 3007.5 [Urine:2690; Drains:117.5; Blood:200] Intake/Output this shift: Total I/O In: 2447.5 [I.V.:2347.5; IV Piggyback:100] Out: 2685 [Urine:2625; Drains:60]  Physical Exam:  General: Alert and oriented. CV: RRR Lungs: Clear bilaterally. GI: Soft, Nondistended. Incisions: Clean, dry, and intact Urine: Clear Extremities: Nontender, no erythema, no edema.  Lab Results:  Recent Labs  02/20/14 1035 02/21/14 0355  HGB 12.0* 11.1*  HCT 35.5* 33.2*      Assessment/Plan: POD# 1 s/p robotic prostatectomy.  1) SL IVF 2) Ambulate, Incentive spirometry 3) Transition to oral pain medication 4) Dulcolax suppository 5) D/C pelvic drain 6) Plan for likely discharge later today   Pryor Curia. MD   LOS: 1 day   Douglas Nielsen,LES 02/21/2014, 6:57 AM

## 2014-02-24 ENCOUNTER — Encounter (HOSPITAL_COMMUNITY): Payer: Self-pay | Admitting: Urology

## 2014-07-15 ENCOUNTER — Other Ambulatory Visit: Payer: Self-pay | Admitting: Dermatology

## 2014-08-26 ENCOUNTER — Encounter: Payer: Self-pay | Admitting: Radiation Oncology

## 2014-08-26 NOTE — Progress Notes (Signed)
GU Location of Tumor / Histology: prostate adeocarcinoma  If Prostate Cancer, Gleason Score is (4 + 3) and PSA is (5.12 on 02/2014)  Crist Fat presented 6 months ago with signs/symptoms of: elevated PSA of 5.12   Biopsies of  prostate revealed:  02/20/14  Diagnosis 1. Prostate, radical resection - PROSTATIC ADENOCARCINOMA, GLEASON'S SCORE 4+3=7. - RIGHT AND LEFT LOBES INVOLVED. - FOCAL EXTRACAPSULAR EXTENSION. - RIGHT SEMINAL VESICLE INVOLVED. - FOCAL MARGIN INVOLVEMENT 2. Lymph nodes, regional resection, Right pelvic - THREE BENIGN LYMPH NODES (0/3) 3. Lymph nodes, regional resection, Left pelvic - FIVE BENIGN LYMPH NODES (0/5). Microscopic Comment 1. PROSTATE RADICAL RESECTION/PROSTATECTOMY Procedure: Prostatectomy. Prostate gland: Weight: 40 grams. Dimension: 3.7 x 4.7 x 3.4 Histologic type: Prostatic adenocarcinoma. Gleasons Score: 4(90%)+3(10%)=7 Tertiary score (if applicable): N/A Involving (half lobe or less, one or both lobes): Both lobes. Estimated proportion (%) of prostate tissue involved by tumor: 12% Extraprostatic extension: Yes, focal adjacent to left seminal vesicle. Involvement of apex: No. Margins: Focal right margin involvement by tumor. Seminal vesicles: Right seminal vesicle involved by tumor. Lymph-Vascular invasion: Not identified. Treatment effect: No. Lymph nodes: number examined - 8 ; number positive - 0 TNM code: pT3b, pN0 Comments: There is perineural invasion by tumor.  Past/Anticipated interventions by urology, if any: radical prostatectomy on 02/19/14, volume 24.5 cc  Past/Anticipated interventions by medical oncology, if any: no  Weight changes, if any: no  Bowel/Bladder complaints, if any:  IPSS 6,  urgency  Nausea/Vomiting, if any: no  Pain issues, if any:  no  SAFETY ISSUES:  Prior radiation? no  Pacemaker/ICD? no  Possible current pregnancy? na  Is the patient on methotrexate? no  Current Complaints / other details:   Married, 2 daughters, retired from [employer] PSA undetectable, for radiation consult due to high risk for local recurrence, Dr Alinda Money

## 2014-08-27 ENCOUNTER — Encounter: Payer: Self-pay | Admitting: Radiation Oncology

## 2014-08-27 ENCOUNTER — Ambulatory Visit
Admission: RE | Admit: 2014-08-27 | Discharge: 2014-08-27 | Disposition: A | Discharge status: Home / Self Care | Payer: Medicare FFS | Source: Ambulatory Visit | Attending: Radiation Oncology | Admitting: Radiation Oncology

## 2014-08-27 VITALS — BP 153/91 | HR 82 | Temp 98.2°F | Resp 20 | Ht 70.0 in | Wt 244.9 lb

## 2014-08-27 DIAGNOSIS — Z510 Encounter for antineoplastic radiation therapy: Secondary | ICD-10-CM | POA: Diagnosis present

## 2014-08-27 DIAGNOSIS — Z87891 Personal history of nicotine dependence: Secondary | ICD-10-CM | POA: Insufficient documentation

## 2014-08-27 DIAGNOSIS — H53419 Scotoma involving central area, unspecified eye: Secondary | ICD-10-CM | POA: Diagnosis not present

## 2014-08-27 DIAGNOSIS — R32 Unspecified urinary incontinence: Secondary | ICD-10-CM | POA: Diagnosis not present

## 2014-08-27 DIAGNOSIS — C61 Malignant neoplasm of prostate: Secondary | ICD-10-CM

## 2014-08-27 DIAGNOSIS — N529 Male erectile dysfunction, unspecified: Secondary | ICD-10-CM | POA: Diagnosis not present

## 2014-08-27 DIAGNOSIS — E7800 Pure hypercholesterolemia, unspecified: Secondary | ICD-10-CM | POA: Diagnosis not present

## 2014-08-27 DIAGNOSIS — Z9079 Acquired absence of other genital organ(s): Secondary | ICD-10-CM | POA: Diagnosis not present

## 2014-08-27 DIAGNOSIS — I10 Essential (primary) hypertension: Secondary | ICD-10-CM | POA: Diagnosis not present

## 2014-08-27 DIAGNOSIS — E039 Hypothyroidism, unspecified: Secondary | ICD-10-CM | POA: Diagnosis not present

## 2014-08-27 DIAGNOSIS — Z9089 Acquired absence of other organs: Secondary | ICD-10-CM | POA: Diagnosis not present

## 2014-08-27 HISTORY — DX: Malignant neoplasm of prostate: C61

## 2014-08-27 NOTE — Progress Notes (Signed)
Please see the Nurse Progress Note in the MD Initial Consult Encounter for this patient. 
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
## 2015-09-25 DIAGNOSIS — C61 Malignant neoplasm of prostate: Secondary | ICD-10-CM | POA: Diagnosis not present

## 2015-10-02 DIAGNOSIS — N5231 Erectile dysfunction following radical prostatectomy: Secondary | ICD-10-CM | POA: Diagnosis not present

## 2015-10-02 DIAGNOSIS — C61 Malignant neoplasm of prostate: Secondary | ICD-10-CM | POA: Diagnosis not present

## 2015-10-05 ENCOUNTER — Ambulatory Visit: Payer: Medicare HMO

## 2015-10-05 ENCOUNTER — Ambulatory Visit: Payer: Medicare HMO | Admitting: Radiation Oncology

## 2015-10-06 ENCOUNTER — Ambulatory Visit: Payer: Medicare FFS | Admitting: Radiation Oncology

## 2015-10-07 ENCOUNTER — Encounter: Payer: Self-pay | Admitting: Radiation Oncology

## 2015-10-07 NOTE — Progress Notes (Signed)
GU Location of Tumor / Histology: prostatic adenocarcinoma   If Prostate Cancer, Gleason Score is (4 + 3) and PSA is (5.12) pretreatment. However, 10/02/2015 PSA was 0.24.  Douglas Nielsen presented March of 2015 with a PSA of 5.12. Patient was evaluated by Dr. Valere Dross 08/27/2014 but, delayed treatment to make an effort to improve his urinary continence.  Biopsies of prostate  (if applicable) revealed:    Past/Anticipated interventions by urology, if any: BNS RAL radical prostatectomy and BPLND on 02/20/14  Past/Anticipated interventions by medical oncology, if any: no  Weight changes, if any: no  Bowel/Bladder complaints, if any: continence has improved (wears one safety liner for protective purposes, periodic urinary urgency and ED   Nausea/Vomiting, if any: no  Pain issues, if any:  no  SAFETY ISSUES:  Prior radiation? no  Pacemaker/ICD? no  Possible current pregnancy? no  Is the patient on methotrexate? no  Current Complaints / other details:  74 year old male. Married with two daughters. Retired from Press photographer at [employer]

## 2015-10-08 ENCOUNTER — Ambulatory Visit
Admission: RE | Admit: 2015-10-08 | Discharge: 2015-10-08 | Disposition: A | Discharge status: Home / Self Care | Payer: Medicare HMO | Source: Ambulatory Visit | Attending: Radiation Oncology | Admitting: Radiation Oncology

## 2015-10-08 ENCOUNTER — Encounter: Payer: Self-pay | Admitting: Radiation Oncology

## 2015-10-08 ENCOUNTER — Ambulatory Visit
Admission: RE | Admit: 2015-10-08 | Discharge: 2015-10-08 | Disposition: A | Discharge status: Home / Self Care | Payer: Medicare HMO | Source: Ambulatory Visit

## 2015-10-08 ENCOUNTER — Ambulatory Visit: Payer: Medicare HMO | Admitting: Radiation Oncology

## 2015-10-08 ENCOUNTER — Ambulatory Visit
Admission: RE | Admit: 2015-10-08 | Discharge: 2015-10-08 | Disposition: A | Discharge status: Home / Self Care | Payer: Medicare FFS | Source: Ambulatory Visit | Attending: Radiation Oncology | Admitting: Radiation Oncology

## 2015-10-08 ENCOUNTER — Ambulatory Visit: Admission: RE | Admit: 2015-10-08 | Payer: Medicare HMO | Source: Ambulatory Visit | Admitting: Radiation Oncology

## 2015-10-08 VITALS — BP 158/93 | HR 73 | Resp 16 | Ht 70.0 in | Wt 248.6 lb

## 2015-10-08 DIAGNOSIS — C61 Malignant neoplasm of prostate: Secondary | ICD-10-CM | POA: Diagnosis not present

## 2015-10-08 DIAGNOSIS — Z510 Encounter for antineoplastic radiation therapy: Secondary | ICD-10-CM | POA: Diagnosis not present

## 2015-10-08 DIAGNOSIS — Z7982 Long term (current) use of aspirin: Secondary | ICD-10-CM | POA: Insufficient documentation

## 2015-10-08 DIAGNOSIS — R9721 Rising PSA following treatment for malignant neoplasm of prostate: Secondary | ICD-10-CM | POA: Diagnosis not present

## 2015-10-08 DIAGNOSIS — Z9889 Other specified postprocedural states: Secondary | ICD-10-CM | POA: Insufficient documentation

## 2015-10-08 NOTE — Progress Notes (Signed)
See progress note under physician encounter. 

## 2015-10-08 NOTE — Progress Notes (Addendum)
Radiation Oncology         [phone] ________________________________  Name: Douglas Nielsen  MRN: [id number]  Date: 10/08/2015  DOB: 1941-08-14  Follow-Up Visit Note  CC: Gara Kroner, MD  Raynelle Bring, MD  Diagnosis: 74 yo gentleman with stage pT3 adenocarcinoma of the prostate with a Gleason score of 4+3 and a prost-prostatectomy PSA of 0.24.    ICD-9-CM ICD-10-CM   1. Prostate cancer ([place]) [place]      Narrative:  The patient returns today for routine follow-up.  He previously saw Dr. Valere Dross in 2015, and Dr. Valere Dross said that the patient was seen today through the courtesy of Dr. Alinda Money for discussion of possible adjuvant radiation therapy in the management of his pathologic stage T3b N0 adenocarcinoma prostate.   He presented with a PSA of 5.12 in March of 2015. He was found to have Gleason 7 (4+3) disease in 2/12 biopsies, and Gleason 6 (3+3) in 3/12 biopsies. He underwent a bilateral nerve sparing robotic radical prostatectomy on 02/20/2014. He had Gleason score 7 (4+3) involving 12% of the prostate. Gleason pattern was seen in 90%, and Gleason pattern 3 was seen in 10% of the specimen. He had focal right margin involvement, right seminal vesicle involvement and extraprostatic extension adjacent to the left seminal vesicle. 8 lymph nodes were free of metastatic disease. There was perineural tumor invasion. He did well postoperatively although he has been slow to recover his urinary continence. He currently uses one pad a day. His incontinence is clearly worse with coughing and activity. He tells me he has not been doing his exercises for the past few months. He still believes that he is been making slow improvement, but is less than satisfied with his continence. He does have erectile dysfunction but this is not a major concern. His postoperative PSA from 08/13/2014 is less than 0.01.  His PSA has now increased to 0.24 and his continence has returned.         ALLERGIES:  is allergic to  lisinopril.  Meds: Current Outpatient Prescriptions  Medication Sig Dispense Refill  . acetaminophen (TYLENOL) 500 MG tablet Take 1,000 mg by mouth every 6 (six) hours as needed for mild pain or moderate pain.    Marland Kitchen levothyroxine (SYNTHROID, LEVOTHROID) 150 MCG tablet Take 150 mcg by mouth every other day. Alternates between 150 and 175    . levothyroxine (SYNTHROID, LEVOTHROID) 175 MCG tablet Take 175 mcg by mouth every other day. Alternates between 150 and 175    . losartan (COZAAR) 25 MG tablet Take 25 mg by mouth every evening.    . phenylephrine (NEO-SYNEPHRINE) 1 % nasal spray Place 1 drop into both nostrils every 6 (six) hours as needed for congestion.    . simvastatin (ZOCOR) 40 MG tablet Take 40 mg by mouth every evening.     No current facility-administered medications for this encounter.    Physical Findings: The patient is in no acute distress. Patient is alert and oriented.  vitals were not taken for this visit..  No significant changes.  Lab Findings: Lab Results  Component Value Date   WBC 7.2 02/14/2014   HGB 11.1* 02/21/2014   HCT 33.2* 02/21/2014   PLT 190 02/14/2014    Lab Results  Component Value Date   NA 140 02/14/2014   K 4.0 02/14/2014   CO2 25 02/14/2014   GLUCOSE 91 02/14/2014   BUN 17 02/14/2014   CREATININE 1.05 02/14/2014   CALCIUM 9.5 02/14/2014    Radiographic  Findings: No results found.  Impression: This is a 74 yo gentleman with stage pT3 adenocarcinoma of the prostate with a Gleason score of 4+3 and a prost-prostatectomy PSA of 0.24.Marland Kitchen He may benefit from salvage radiotherapy. There could also be some benefit with hormone therapy.   Plan:  Today, I talked to the patient and family about the findings and work-up thus far.  We discussed the natural history of biochemically recurrent prostate cancer and general treatment, highlighting the role of radiotherapy in the management.  We discussed the available radiation techniques, and focused on  the details of logistics and delivery.  We reviewed the anticipated acute and late sequelae associated with radiation in this setting.  The patient was encouraged to ask questions that I answered to the best of my ability. The patient would like to proceed with radiation and will be scheduled for CT simulation on 11/10 at 8 am.  Since the patients psa nearly doubled in one week, I will draw another PSA at the time of his planning for prognostic value.  Today, we discussed the addition of hormone therapy to radiation in this setting. After discussing the pros and cons, the patient does not wish to undergo hormone therapy. We will revisit this after his updated psa next week if indicated.   _____________________________________  Sheral Apley. Tammi Klippel, M.D.   This document serves as a record of services personally performed by [name], MD. It was created on his behalf by Lendon Collar, a trained medical scribe. The creation of this record is based on the scribe's personal observations and the provider's statements to them. This document has been checked and approved by the attending provider.

## 2015-10-08 NOTE — Addendum Note (Signed)
Encounter addended by: [name], MD on: 10/08/2015  8:27 PM<BR>     Documentation filed: Notes Section, Problem List

## 2015-10-13 DIAGNOSIS — Z23 Encounter for immunization: Secondary | ICD-10-CM | POA: Diagnosis not present

## 2015-10-15 ENCOUNTER — Encounter: Payer: Self-pay | Admitting: *Deleted

## 2015-10-15 ENCOUNTER — Ambulatory Visit
Admission: RE | Admit: 2015-10-15 | Discharge: 2015-10-15 | Disposition: A | Discharge status: Home / Self Care | Payer: Medicare HMO | Source: Ambulatory Visit | Attending: Radiation Oncology | Admitting: Radiation Oncology

## 2015-10-15 DIAGNOSIS — Z510 Encounter for antineoplastic radiation therapy: Secondary | ICD-10-CM | POA: Diagnosis not present

## 2015-10-15 DIAGNOSIS — C61 Malignant neoplasm of prostate: Secondary | ICD-10-CM | POA: Diagnosis not present

## 2015-10-15 DIAGNOSIS — R9721 Rising PSA following treatment for malignant neoplasm of prostate: Secondary | ICD-10-CM | POA: Diagnosis not present

## 2015-10-15 DIAGNOSIS — Z9889 Other specified postprocedural states: Secondary | ICD-10-CM | POA: Diagnosis not present

## 2015-10-15 DIAGNOSIS — Z7982 Long term (current) use of aspirin: Secondary | ICD-10-CM | POA: Diagnosis not present

## 2015-10-15 NOTE — Progress Notes (Signed)
  Radiation Oncology         [phone] ________________________________  Name: Douglas Nielsen  MRN: [id number]  Date: 10/15/2015  DOB: March 07, 1941  SIMULATION AND TREATMENT PLANNING NOTE    ICD-9-CM ICD-10-CM   1. Prostate cancer ([place]) 185 C61     DIAGNOSIS:  74 yo gentleman with stage pT3 adenocarcinoma of the prostate with a Gleason score of 4+3 and a prost-prostatectomy PSA of 0.24  NARRATIVE:  The patient was brought to the [facility].  Identity was confirmed.  All relevant records and images related to the planned course of therapy were reviewed.  The patient freely provided informed written consent to proceed with treatment after reviewing the details related to the planned course of therapy. The consent form was witnessed and verified by the simulation staff.  Then, the patient was set-up in a stable reproducible supine position for radiation therapy.  A vacuum lock pillow device was custom fabricated to position his legs in a reproducible immobilized position.  Then, I performed a urethrogram under sterile conditions to identify the prostatic apex.  CT images were obtained.  Surface markings were placed.  The CT images were loaded into the planning software.  Then the prostate target and avoidance structures including the rectum, bladder, bowel and hips were contoured.  Treatment planning then occurred.  The radiation prescription was entered and confirmed.  A total of 1 complex treatment devices were fabricated. I have requested : Intensity Modulated Radiotherapy (IMRT) is medically necessary for this case for the following reason:  Rectal sparing.Marland Kitchen  PLAN:  The patient will receive 68.4 Gy in 38 fractions.  ________________________________  Sheral Apley Tammi Klippel, M.D.  This document serves as a record of services personally performed by [name], MD. It was created on his behalf by Arlyce Harman, a trained medical scribe. The creation of this record is based on  the scribe's personal observations and the provider's statements to them. This document has been checked and approved by the attending provider.

## 2015-10-15 NOTE — Progress Notes (Signed)
[place] Psychosocial Distress Screening Clinical Social Work  Clinical Social Work was referred by distress screening protocol.  The patient scored a 7 on the Psychosocial Distress Thermometer which indicates severe distress. Clinical Social Worker phoned pt to assess for distress and other psychosocial needs. CSW spoke with pt and wife, introduced self, explained role of CSW and they report to be doing well currently and denied concerns. They report to have good support and deny needs currently. CSW encouraged Prostate CAncer Support group as a a source of support. CSW available as needs arise and they agree to reach out as needed.   ONCBCN DISTRESS SCREENING 10/08/2015  Screening Type Initial Screening  Distress experienced in past week (1-10) 7  Practical problem type Work/school  Emotional problem type Adjusting to illness  Information Concerns Type   Physician notified of physical symptoms Yes  Referral to clinical psychology No  Referral to clinical social work Yes  Referral to dietition No  Referral to financial advocate No  Referral to support programs No  Referral to palliative care No  Other     Clinical Social Worker follow up needed: No.  If yes, follow up plan: Loren Racer, [place]  [facility] Phone: [phone] Fax: [phone]

## 2015-10-22 DIAGNOSIS — R9721 Rising PSA following treatment for malignant neoplasm of prostate: Secondary | ICD-10-CM | POA: Diagnosis not present

## 2015-10-22 DIAGNOSIS — Z7982 Long term (current) use of aspirin: Secondary | ICD-10-CM | POA: Diagnosis not present

## 2015-10-22 DIAGNOSIS — Z510 Encounter for antineoplastic radiation therapy: Secondary | ICD-10-CM | POA: Diagnosis not present

## 2015-10-22 DIAGNOSIS — C61 Malignant neoplasm of prostate: Secondary | ICD-10-CM | POA: Diagnosis not present

## 2015-10-22 DIAGNOSIS — Z9889 Other specified postprocedural states: Secondary | ICD-10-CM | POA: Diagnosis not present

## 2015-10-26 ENCOUNTER — Ambulatory Visit: Admission: RE | Admit: 2015-10-26 | Payer: Medicare HMO | Source: Ambulatory Visit | Admitting: Radiation Oncology

## 2015-10-27 ENCOUNTER — Ambulatory Visit: Admission: RE | Admit: 2015-10-27 | Payer: Medicare HMO | Source: Ambulatory Visit | Admitting: Radiation Oncology

## 2015-10-27 ENCOUNTER — Encounter: Payer: Self-pay | Admitting: Radiation Oncology

## 2015-10-27 ENCOUNTER — Ambulatory Visit
Admission: RE | Admit: 2015-10-27 | Discharge: 2015-10-27 | Disposition: A | Discharge status: Home / Self Care | Payer: Medicare HMO | Source: Ambulatory Visit | Attending: Radiation Oncology | Admitting: Radiation Oncology

## 2015-10-27 ENCOUNTER — Encounter: Payer: Self-pay | Admitting: Medical Oncology

## 2015-10-27 ENCOUNTER — Ambulatory Visit
Admission: RE | Admit: 2015-10-27 | Discharge: 2015-10-27 | Disposition: A | Discharge status: Home / Self Care | Payer: Medicare FFS | Source: Ambulatory Visit | Attending: Radiation Oncology | Admitting: Radiation Oncology

## 2015-10-27 VITALS — BP 177/93 | HR 65 | Temp 97.6°F | Resp 18 | Wt 248.8 lb

## 2015-10-27 DIAGNOSIS — Z7982 Long term (current) use of aspirin: Secondary | ICD-10-CM | POA: Diagnosis not present

## 2015-10-27 DIAGNOSIS — Z510 Encounter for antineoplastic radiation therapy: Secondary | ICD-10-CM | POA: Diagnosis not present

## 2015-10-27 DIAGNOSIS — R9721 Rising PSA following treatment for malignant neoplasm of prostate: Secondary | ICD-10-CM | POA: Diagnosis not present

## 2015-10-27 DIAGNOSIS — C61 Malignant neoplasm of prostate: Secondary | ICD-10-CM | POA: Diagnosis not present

## 2015-10-27 DIAGNOSIS — Z9889 Other specified postprocedural states: Secondary | ICD-10-CM | POA: Diagnosis not present

## 2015-10-27 NOTE — Progress Notes (Signed)
   Department of Radiation Oncology  Phone:  [phone] Fax:        [phone]  Weekly Treatment Note    Name: Douglas Nielsen Date: 10/27/2015 MRN: [id number] DOB: 01/17/1941   Current dose: 1.8 Gy  Current fraction: 1   MEDICATIONS: Current Outpatient Prescriptions  Medication Sig Dispense Refill  . acetaminophen (TYLENOL) 500 MG tablet Take 1,000 mg by mouth every 6 (six) hours as needed for mild pain or moderate pain.    Marland Kitchen levothyroxine (SYNTHROID, LEVOTHROID) 150 MCG tablet Take 150 mcg by mouth every other day. Alternates between 150 and 175    . levothyroxine (SYNTHROID, LEVOTHROID) 175 MCG tablet Take 175 mcg by mouth every other day. Alternates between 150 and 175    . loratadine (CLARITIN) 10 MG tablet Take 10 mg by mouth daily.    Marland Kitchen losartan (COZAAR) 25 MG tablet Take 25 mg by mouth every evening.    . phenylephrine (NEO-SYNEPHRINE) 1 % nasal spray Place 1 drop into both nostrils every 6 (six) hours as needed for congestion.    . simvastatin (ZOCOR) 40 MG tablet Take 40 mg by mouth every evening.     No current facility-administered medications for this encounter.     ALLERGIES: Lisinopril   LABORATORY DATA:  Lab Results  Component Value Date   WBC 7.2 02/14/2014   HGB 11.1* 02/21/2014   HCT 33.2* 02/21/2014   MCV 85.8 02/14/2014   PLT 190 02/14/2014   Lab Results  Component Value Date   NA 140 02/14/2014   K 4.0 02/14/2014   CL 102 02/14/2014   CO2 25 02/14/2014   No results found for: ALT, AST, GGT, ALKPHOS, BILITOT   NARRATIVE: Douglas Nielsen was seen today for weekly treatment management. The chart was checked and the patient's films were reviewed.  Weight and vitals stable. Denies pain. Reports he completely empties each time he voids. Describes a strong steady urine stream. Denies dysuria or hematuria. Denies incontinence. Denies diarrhea. Denies fatigue. Reports nocturia x 3. Oriented patient to staff and routine of the clinic. Provided  patient with RADIATION THERAPY AND YOU handbook then, reviewed pertinent information. Educated patient reference potential side effects and management such as, fatigue, skin changes, urinary/bladder changes and diarrhea. Provided patient with this RNs business card and encouraged him to call with needs. Patient verbalized understanding of all reviewed.   BP 177/93 mmHg  Pulse 65  Temp(Src) 97.6 F (36.4 C) (Oral)  Resp 18  Wt 248 lb 12.8 oz (112.855 kg)  SpO2 100% Wt Readings from Last 3 Encounters:  10/27/15 248 lb 12.8 oz (112.855 kg)  10/08/15 248 lb 9.6 oz (112.764 kg)  08/27/14 244 lb 14.4 oz (111.086 kg)     PHYSICAL EXAMINATION: weight is 248 lb 12.8 oz (112.855 kg). His oral temperature is 97.6 F (36.4 C). His blood pressure is 177/93 and his pulse is 65. His respiration is 18 and oxygen saturation is 100%.        ASSESSMENT: The patient is doing satisfactorily with treatment.  PLAN: We will continue with the patient's radiation treatment as planned. The patient did well the first week of treatment. We have discussed rectal filling and he will pay attention to this.

## 2015-10-27 NOTE — Progress Notes (Signed)
Oncology Nurse Navigator Documentation  Oncology Nurse Navigator Flowsheets 10/27/2015  Referral date to RadOnc/MedOnc 10/08/2015  Navigator Encounter Type Treatment- I met Mr. Meleski today. He was directly referred to Dr. Tammi Klippel 10/08/15. I introduced myself and discussed my role as the navigator and provided him with my business card. He states that being diagnosed with cancer has been hard and he may need to call me for some emotional support. I asked him to please call me and I can make a referral to support services if needed.  He voiced understanding.  Patient Visit Type Radonc  Treatment Phase Treatment  Barriers/Navigation Needs No barriers at this time  Support Groups/Services Friends and Family  Time Spent with Patient 15

## 2015-10-27 NOTE — Progress Notes (Signed)
Weight and vitals stable. Denies pain. Reports he completely empties each time he voids. Describes a strong steady urine stream. Denies dysuria or hematuria. Denies incontinence. Denies diarrhea. Denies fatigue. Reports nocturia x 3. Oriented patient to staff and routine of the clinic. Provided patient with RADIATION THERAPY AND YOU handbook then, reviewed pertinent information. Educated patient reference potential side effects and management such as, fatigue, skin changes, urinary/bladder changes and diarrhea. Provided patient with this RNs business card and encouraged him to call with needs. Patient verbalized understanding of all reviewed.   BP 177/93 mmHg  Pulse 65  Temp(Src) 97.6 F (36.4 C) (Oral)  Resp 18  Wt 248 lb 12.8 oz (112.855 kg)  SpO2 100% Wt Readings from Last 3 Encounters:  10/27/15 248 lb 12.8 oz (112.855 kg)  10/08/15 248 lb 9.6 oz (112.764 kg)  08/27/14 244 lb 14.4 oz (111.086 kg)

## 2015-10-28 ENCOUNTER — Ambulatory Visit
Admission: RE | Admit: 2015-10-28 | Discharge: 2015-10-28 | Disposition: A | Discharge status: Home / Self Care | Payer: Medicare HMO | Source: Ambulatory Visit | Attending: Radiation Oncology | Admitting: Radiation Oncology

## 2015-10-28 DIAGNOSIS — Z7982 Long term (current) use of aspirin: Secondary | ICD-10-CM | POA: Diagnosis not present

## 2015-10-28 DIAGNOSIS — C61 Malignant neoplasm of prostate: Secondary | ICD-10-CM | POA: Diagnosis not present

## 2015-10-28 DIAGNOSIS — R9721 Rising PSA following treatment for malignant neoplasm of prostate: Secondary | ICD-10-CM | POA: Diagnosis not present

## 2015-10-28 DIAGNOSIS — Z510 Encounter for antineoplastic radiation therapy: Secondary | ICD-10-CM | POA: Diagnosis not present

## 2015-10-28 DIAGNOSIS — Z9889 Other specified postprocedural states: Secondary | ICD-10-CM | POA: Diagnosis not present

## 2015-11-02 ENCOUNTER — Ambulatory Visit
Admission: RE | Admit: 2015-11-02 | Discharge: 2015-11-02 | Disposition: A | Discharge status: Home / Self Care | Payer: Medicare HMO | Source: Ambulatory Visit | Attending: Radiation Oncology | Admitting: Radiation Oncology

## 2015-11-02 DIAGNOSIS — C61 Malignant neoplasm of prostate: Secondary | ICD-10-CM | POA: Diagnosis not present

## 2015-11-02 DIAGNOSIS — Z510 Encounter for antineoplastic radiation therapy: Secondary | ICD-10-CM | POA: Diagnosis not present

## 2015-11-02 DIAGNOSIS — R9721 Rising PSA following treatment for malignant neoplasm of prostate: Secondary | ICD-10-CM | POA: Diagnosis not present

## 2015-11-02 DIAGNOSIS — Z7982 Long term (current) use of aspirin: Secondary | ICD-10-CM | POA: Diagnosis not present

## 2015-11-02 DIAGNOSIS — Z9889 Other specified postprocedural states: Secondary | ICD-10-CM | POA: Diagnosis not present

## 2015-11-03 ENCOUNTER — Ambulatory Visit
Admission: RE | Admit: 2015-11-03 | Discharge: 2015-11-03 | Disposition: A | Discharge status: Home / Self Care | Payer: Medicare HMO | Source: Ambulatory Visit | Attending: Radiation Oncology | Admitting: Radiation Oncology

## 2015-11-03 DIAGNOSIS — R9721 Rising PSA following treatment for malignant neoplasm of prostate: Secondary | ICD-10-CM | POA: Diagnosis not present

## 2015-11-03 DIAGNOSIS — Z510 Encounter for antineoplastic radiation therapy: Secondary | ICD-10-CM | POA: Diagnosis not present

## 2015-11-03 DIAGNOSIS — C61 Malignant neoplasm of prostate: Secondary | ICD-10-CM | POA: Diagnosis not present

## 2015-11-03 DIAGNOSIS — Z9889 Other specified postprocedural states: Secondary | ICD-10-CM | POA: Diagnosis not present

## 2015-11-03 DIAGNOSIS — Z7982 Long term (current) use of aspirin: Secondary | ICD-10-CM | POA: Diagnosis not present

## 2015-11-04 ENCOUNTER — Ambulatory Visit: Payer: Medicare FFS | Admitting: Radiation Oncology

## 2015-11-04 ENCOUNTER — Ambulatory Visit
Admission: RE | Admit: 2015-11-04 | Discharge: 2015-11-04 | Disposition: A | Discharge status: Home / Self Care | Payer: Medicare HMO | Source: Ambulatory Visit | Attending: Radiation Oncology | Admitting: Radiation Oncology

## 2015-11-04 DIAGNOSIS — Z7982 Long term (current) use of aspirin: Secondary | ICD-10-CM | POA: Diagnosis not present

## 2015-11-04 DIAGNOSIS — R9721 Rising PSA following treatment for malignant neoplasm of prostate: Secondary | ICD-10-CM | POA: Diagnosis not present

## 2015-11-04 DIAGNOSIS — Z9889 Other specified postprocedural states: Secondary | ICD-10-CM | POA: Diagnosis not present

## 2015-11-04 DIAGNOSIS — C61 Malignant neoplasm of prostate: Secondary | ICD-10-CM | POA: Diagnosis not present

## 2015-11-04 DIAGNOSIS — Z510 Encounter for antineoplastic radiation therapy: Secondary | ICD-10-CM | POA: Diagnosis not present

## 2015-11-05 ENCOUNTER — Ambulatory Visit
Admission: RE | Admit: 2015-11-05 | Discharge: 2015-11-05 | Disposition: A | Discharge status: Home / Self Care | Payer: Medicare HMO | Source: Ambulatory Visit | Attending: Radiation Oncology | Admitting: Radiation Oncology

## 2015-11-05 DIAGNOSIS — Z510 Encounter for antineoplastic radiation therapy: Secondary | ICD-10-CM | POA: Diagnosis not present

## 2015-11-05 DIAGNOSIS — R9721 Rising PSA following treatment for malignant neoplasm of prostate: Secondary | ICD-10-CM | POA: Diagnosis not present

## 2015-11-05 DIAGNOSIS — C61 Malignant neoplasm of prostate: Secondary | ICD-10-CM | POA: Diagnosis not present

## 2015-11-05 DIAGNOSIS — Z7982 Long term (current) use of aspirin: Secondary | ICD-10-CM | POA: Diagnosis not present

## 2015-11-05 DIAGNOSIS — Z9889 Other specified postprocedural states: Secondary | ICD-10-CM | POA: Diagnosis not present

## 2015-11-06 ENCOUNTER — Encounter: Payer: Self-pay | Admitting: Radiation Oncology

## 2015-11-06 ENCOUNTER — Ambulatory Visit
Admission: RE | Admit: 2015-11-06 | Discharge: 2015-11-06 | Disposition: A | Discharge status: Home / Self Care | Payer: Medicare HMO | Source: Ambulatory Visit | Attending: Radiation Oncology | Admitting: Radiation Oncology

## 2015-11-06 VITALS — BP 141/82 | HR 61 | Temp 97.5°F | Resp 20 | Wt 247.2 lb

## 2015-11-06 DIAGNOSIS — Z7982 Long term (current) use of aspirin: Secondary | ICD-10-CM | POA: Diagnosis not present

## 2015-11-06 DIAGNOSIS — Z9889 Other specified postprocedural states: Secondary | ICD-10-CM | POA: Diagnosis not present

## 2015-11-06 DIAGNOSIS — C61 Malignant neoplasm of prostate: Secondary | ICD-10-CM

## 2015-11-06 DIAGNOSIS — R9721 Rising PSA following treatment for malignant neoplasm of prostate: Secondary | ICD-10-CM | POA: Diagnosis not present

## 2015-11-06 DIAGNOSIS — Z510 Encounter for antineoplastic radiation therapy: Secondary | ICD-10-CM | POA: Diagnosis not present

## 2015-11-06 NOTE — Progress Notes (Signed)
Weekly rad txs prostate 7/38 completed, slower stream, increased frequency voiding,  Nocturia 2-3x, no hematuria, regular bowels, no pain, stated 12:18 PM BP 141/82 mmHg  Pulse 61  Temp(Src) 97.5 F (36.4 C) (Oral)  Resp 20  Wt 247 lb 3.2 oz (112.129 kg)  Wt Readings from Last 3 Encounters:  11/06/15 247 lb 3.2 oz (112.129 kg)  10/27/15 248 lb 12.8 oz (112.855 kg)  10/08/15 248 lb 9.6 oz (112.764 kg)

## 2015-11-06 NOTE — Progress Notes (Signed)
  Radiation Oncology         [phone]   Name: Douglas Nielsen MRN: [id number]   Date: 11/06/2015  DOB: [birth year]     Weekly Radiation Therapy Management    ICD-9-CM ICD-10-CM   1. Prostate cancer ([place]) 185 C61     Current Dose: 12.6 Gy  Planned Dose:  68.4 Gy  Narrative The patient presents for routine under treatment assessment.  Weekly rad txs prostate 7/38 completed. Slower stream. Increased frequency voiding. Nocturia 2-3x. No hematuria. Regular bowels. No pain.  The patient is without complaint. Set-up films were reviewed. The chart was checked.  Physical Findings  weight is 247 lb 3.2 oz (112.129 kg). His oral temperature is 97.5 F (36.4 C). His blood pressure is 141/82 and his pulse is 61. His respiration is 20. . Weight essentially stable.  No significant changes.  Impression The patient is tolerating radiation.  Plan Continue treatment as planned.         Sheral Apley Tammi Klippel, M.D.  This document serves as a record of services personally performed by [name], MD. It was created on his behalf by Arlyce Harman, a trained medical scribe. The creation of this record is based on the scribe's personal observations and the provider's statements to them. This document has been checked and approved by the attending provider.

## 2015-11-09 ENCOUNTER — Ambulatory Visit
Admission: RE | Admit: 2015-11-09 | Discharge: 2015-11-09 | Disposition: A | Discharge status: Home / Self Care | Payer: Medicare HMO | Source: Ambulatory Visit | Attending: Radiation Oncology | Admitting: Radiation Oncology

## 2015-11-09 DIAGNOSIS — Z510 Encounter for antineoplastic radiation therapy: Secondary | ICD-10-CM | POA: Diagnosis not present

## 2015-11-09 DIAGNOSIS — Z9889 Other specified postprocedural states: Secondary | ICD-10-CM | POA: Diagnosis not present

## 2015-11-09 DIAGNOSIS — R9721 Rising PSA following treatment for malignant neoplasm of prostate: Secondary | ICD-10-CM | POA: Diagnosis not present

## 2015-11-09 DIAGNOSIS — Z7982 Long term (current) use of aspirin: Secondary | ICD-10-CM | POA: Diagnosis not present

## 2015-11-09 DIAGNOSIS — C61 Malignant neoplasm of prostate: Secondary | ICD-10-CM | POA: Diagnosis not present

## 2015-11-10 ENCOUNTER — Ambulatory Visit
Admission: RE | Admit: 2015-11-10 | Discharge: 2015-11-10 | Disposition: A | Discharge status: Home / Self Care | Payer: Medicare HMO | Source: Ambulatory Visit | Attending: Radiation Oncology | Admitting: Radiation Oncology

## 2015-11-10 DIAGNOSIS — Z7982 Long term (current) use of aspirin: Secondary | ICD-10-CM | POA: Diagnosis not present

## 2015-11-10 DIAGNOSIS — Z510 Encounter for antineoplastic radiation therapy: Secondary | ICD-10-CM | POA: Diagnosis not present

## 2015-11-10 DIAGNOSIS — Z9889 Other specified postprocedural states: Secondary | ICD-10-CM | POA: Diagnosis not present

## 2015-11-10 DIAGNOSIS — C61 Malignant neoplasm of prostate: Secondary | ICD-10-CM | POA: Diagnosis not present

## 2015-11-10 DIAGNOSIS — R9721 Rising PSA following treatment for malignant neoplasm of prostate: Secondary | ICD-10-CM | POA: Diagnosis not present

## 2015-11-11 ENCOUNTER — Ambulatory Visit
Admission: RE | Admit: 2015-11-11 | Discharge: 2015-11-11 | Disposition: A | Discharge status: Home / Self Care | Payer: Medicare HMO | Source: Ambulatory Visit | Attending: Radiation Oncology | Admitting: Radiation Oncology

## 2015-11-11 DIAGNOSIS — C61 Malignant neoplasm of prostate: Secondary | ICD-10-CM | POA: Diagnosis not present

## 2015-11-11 DIAGNOSIS — Z9889 Other specified postprocedural states: Secondary | ICD-10-CM | POA: Diagnosis not present

## 2015-11-11 DIAGNOSIS — R9721 Rising PSA following treatment for malignant neoplasm of prostate: Secondary | ICD-10-CM | POA: Diagnosis not present

## 2015-11-11 DIAGNOSIS — Z510 Encounter for antineoplastic radiation therapy: Secondary | ICD-10-CM | POA: Diagnosis not present

## 2015-11-11 DIAGNOSIS — Z7982 Long term (current) use of aspirin: Secondary | ICD-10-CM | POA: Diagnosis not present

## 2015-11-12 ENCOUNTER — Ambulatory Visit
Admission: RE | Admit: 2015-11-12 | Discharge: 2015-11-12 | Disposition: A | Discharge status: Home / Self Care | Payer: Medicare HMO | Source: Ambulatory Visit | Attending: Radiation Oncology | Admitting: Radiation Oncology

## 2015-11-12 ENCOUNTER — Encounter: Payer: Self-pay | Admitting: Radiation Oncology

## 2015-11-12 VITALS — BP 170/91 | HR 64 | Temp 97.7°F | Resp 16 | Ht 70.0 in | Wt 246.1 lb

## 2015-11-12 DIAGNOSIS — Z7982 Long term (current) use of aspirin: Secondary | ICD-10-CM | POA: Diagnosis not present

## 2015-11-12 DIAGNOSIS — R9721 Rising PSA following treatment for malignant neoplasm of prostate: Secondary | ICD-10-CM | POA: Diagnosis not present

## 2015-11-12 DIAGNOSIS — C61 Malignant neoplasm of prostate: Secondary | ICD-10-CM

## 2015-11-12 DIAGNOSIS — Z510 Encounter for antineoplastic radiation therapy: Secondary | ICD-10-CM | POA: Diagnosis not present

## 2015-11-12 DIAGNOSIS — Z9889 Other specified postprocedural states: Secondary | ICD-10-CM | POA: Diagnosis not present

## 2015-11-12 NOTE — Progress Notes (Signed)
Douglas Nielsen has completed 11 fractions to his prostate.  He denies pain.  He reports and increase in urinary frequency during the day and has nocturia x 4.  He denies having dysuria, hematuria and bowel issues.  He reports slight fatigue.  BP 170/91 mmHg  Pulse 64  Temp(Src) 97.7 F (36.5 C) (Oral)  Resp 16  Ht 5\' 10"  (1.778 m)  Wt 246 lb 1.6 oz (111.63 kg)  BMI 35.31 kg/m2

## 2015-11-12 NOTE — Progress Notes (Signed)
  Radiation Oncology         [phone]   Name: Douglas Nielsen MRN: [id number]   Date: 11/12/2015  DOB: [birth year]     Weekly Radiation Therapy Management    ICD-9-CM ICD-10-CM   1. Prostate cancer ([place]) 185 C61     Current Dose: 19.8 Gy  Planned Dose:  68.4 Gy  Narrative The patient presents for routine under treatment assessment.  The patient has completed 11 fractions to his prostate. He denies pain. He reports an increase in urinary frequency during the day and has nocturia x 4. He denies having dysuria, hematuria, and bowel issues. He reports slight fatigue.  The patient is without complaint. Set-up films were reviewed. The chart was checked.  Physical Findings  height is 5\' 10"  (1.778 m) and weight is 246 lb 1.6 oz (111.63 kg). His oral temperature is 97.7 F (36.5 C). His blood pressure is 170/91 and his pulse is 64. His respiration is 16. . Weight essentially stable.  No significant changes.  Impression The patient is tolerating radiation.  Plan Continue treatment as planned.         Sheral Apley Tammi Klippel, M.D.  This document serves as a record of services personally performed by [name], MD. It was created on his behalf by Lendon Collar, a trained medical scribe. The creation of this record is based on the scribe's personal observations and the provider's statements to them. This document has been checked and approved by the attending provider.

## 2015-11-13 ENCOUNTER — Ambulatory Visit
Admission: RE | Admit: 2015-11-13 | Discharge: 2015-11-13 | Disposition: A | Discharge status: Home / Self Care | Payer: Medicare HMO | Source: Ambulatory Visit | Attending: Radiation Oncology | Admitting: Radiation Oncology

## 2015-11-13 DIAGNOSIS — Z9889 Other specified postprocedural states: Secondary | ICD-10-CM | POA: Diagnosis not present

## 2015-11-13 DIAGNOSIS — Z510 Encounter for antineoplastic radiation therapy: Secondary | ICD-10-CM | POA: Diagnosis not present

## 2015-11-13 DIAGNOSIS — R9721 Rising PSA following treatment for malignant neoplasm of prostate: Secondary | ICD-10-CM | POA: Diagnosis not present

## 2015-11-13 DIAGNOSIS — C61 Malignant neoplasm of prostate: Secondary | ICD-10-CM | POA: Diagnosis not present

## 2015-11-13 DIAGNOSIS — Z7982 Long term (current) use of aspirin: Secondary | ICD-10-CM | POA: Diagnosis not present

## 2015-11-16 ENCOUNTER — Ambulatory Visit
Admission: RE | Admit: 2015-11-16 | Discharge: 2015-11-16 | Disposition: A | Discharge status: Home / Self Care | Payer: Medicare HMO | Source: Ambulatory Visit | Attending: Radiation Oncology | Admitting: Radiation Oncology

## 2015-11-16 DIAGNOSIS — Z510 Encounter for antineoplastic radiation therapy: Secondary | ICD-10-CM | POA: Diagnosis not present

## 2015-11-16 DIAGNOSIS — C61 Malignant neoplasm of prostate: Secondary | ICD-10-CM | POA: Diagnosis not present

## 2015-11-16 DIAGNOSIS — R9721 Rising PSA following treatment for malignant neoplasm of prostate: Secondary | ICD-10-CM | POA: Diagnosis not present

## 2015-11-16 DIAGNOSIS — Z9889 Other specified postprocedural states: Secondary | ICD-10-CM | POA: Diagnosis not present

## 2015-11-16 DIAGNOSIS — Z7982 Long term (current) use of aspirin: Secondary | ICD-10-CM | POA: Diagnosis not present

## 2015-11-17 ENCOUNTER — Ambulatory Visit
Admission: RE | Admit: 2015-11-17 | Discharge: 2015-11-17 | Disposition: A | Discharge status: Home / Self Care | Payer: Medicare HMO | Source: Ambulatory Visit | Attending: Radiation Oncology | Admitting: Radiation Oncology

## 2015-11-17 DIAGNOSIS — Z510 Encounter for antineoplastic radiation therapy: Secondary | ICD-10-CM | POA: Diagnosis not present

## 2015-11-17 DIAGNOSIS — Z7982 Long term (current) use of aspirin: Secondary | ICD-10-CM | POA: Diagnosis not present

## 2015-11-17 DIAGNOSIS — Z9889 Other specified postprocedural states: Secondary | ICD-10-CM | POA: Diagnosis not present

## 2015-11-17 DIAGNOSIS — C61 Malignant neoplasm of prostate: Secondary | ICD-10-CM | POA: Diagnosis not present

## 2015-11-17 DIAGNOSIS — R9721 Rising PSA following treatment for malignant neoplasm of prostate: Secondary | ICD-10-CM | POA: Diagnosis not present

## 2015-11-18 ENCOUNTER — Ambulatory Visit
Admission: RE | Admit: 2015-11-18 | Discharge: 2015-11-18 | Disposition: A | Discharge status: Home / Self Care | Payer: Medicare HMO | Source: Ambulatory Visit | Attending: Radiation Oncology | Admitting: Radiation Oncology

## 2015-11-18 DIAGNOSIS — Z9889 Other specified postprocedural states: Secondary | ICD-10-CM | POA: Diagnosis not present

## 2015-11-18 DIAGNOSIS — Z7982 Long term (current) use of aspirin: Secondary | ICD-10-CM | POA: Diagnosis not present

## 2015-11-18 DIAGNOSIS — Z510 Encounter for antineoplastic radiation therapy: Secondary | ICD-10-CM | POA: Diagnosis not present

## 2015-11-18 DIAGNOSIS — R9721 Rising PSA following treatment for malignant neoplasm of prostate: Secondary | ICD-10-CM | POA: Diagnosis not present

## 2015-11-18 DIAGNOSIS — C61 Malignant neoplasm of prostate: Secondary | ICD-10-CM | POA: Diagnosis not present

## 2015-11-19 ENCOUNTER — Ambulatory Visit
Admission: RE | Admit: 2015-11-19 | Discharge: 2015-11-19 | Disposition: A | Discharge status: Home / Self Care | Payer: Medicare HMO | Source: Ambulatory Visit | Attending: Radiation Oncology | Admitting: Radiation Oncology

## 2015-11-19 DIAGNOSIS — Z9889 Other specified postprocedural states: Secondary | ICD-10-CM | POA: Diagnosis not present

## 2015-11-19 DIAGNOSIS — C61 Malignant neoplasm of prostate: Secondary | ICD-10-CM | POA: Diagnosis not present

## 2015-11-19 DIAGNOSIS — Z510 Encounter for antineoplastic radiation therapy: Secondary | ICD-10-CM | POA: Diagnosis not present

## 2015-11-19 DIAGNOSIS — Z7982 Long term (current) use of aspirin: Secondary | ICD-10-CM | POA: Diagnosis not present

## 2015-11-19 DIAGNOSIS — R9721 Rising PSA following treatment for malignant neoplasm of prostate: Secondary | ICD-10-CM | POA: Diagnosis not present

## 2015-11-20 ENCOUNTER — Ambulatory Visit
Admission: RE | Admit: 2015-11-20 | Discharge: 2015-11-20 | Disposition: A | Discharge status: Home / Self Care | Payer: Medicare HMO | Source: Ambulatory Visit | Attending: Radiation Oncology | Admitting: Radiation Oncology

## 2015-11-20 ENCOUNTER — Encounter: Payer: Self-pay | Admitting: Radiation Oncology

## 2015-11-20 VITALS — BP 132/83 | HR 59 | Resp 16 | Wt 246.3 lb

## 2015-11-20 DIAGNOSIS — Z7982 Long term (current) use of aspirin: Secondary | ICD-10-CM | POA: Diagnosis not present

## 2015-11-20 DIAGNOSIS — Z9889 Other specified postprocedural states: Secondary | ICD-10-CM | POA: Diagnosis not present

## 2015-11-20 DIAGNOSIS — Z510 Encounter for antineoplastic radiation therapy: Secondary | ICD-10-CM | POA: Diagnosis not present

## 2015-11-20 DIAGNOSIS — R9721 Rising PSA following treatment for malignant neoplasm of prostate: Secondary | ICD-10-CM | POA: Diagnosis not present

## 2015-11-20 DIAGNOSIS — C61 Malignant neoplasm of prostate: Secondary | ICD-10-CM

## 2015-11-20 NOTE — Progress Notes (Signed)
Weight and vitals stable. Denies pain. Reports nocturia x 2. Denies dysuria or hematuria. Describes a steady urine stream. Reports occasional urgency. Denies incontinence or leakage. Denies diarrhea or rectal irritation. Reports mild fatigue.   BP 132/83 mmHg  Pulse 59  Resp 16  Wt 246 lb 4.8 oz (111.721 kg)  SpO2 100% Wt Readings from Last 3 Encounters:  11/20/15 246 lb 4.8 oz (111.721 kg)  11/12/15 246 lb 1.6 oz (111.63 kg)  11/06/15 247 lb 3.2 oz (112.129 kg)

## 2015-11-20 NOTE — Progress Notes (Signed)
  Radiation Oncology         [phone]   Name: Douglas Nielsen MRN: [id number]   Date: 11/20/2015  DOB: 09-Jul-1941     Weekly Radiation Therapy Management    ICD-9-CM ICD-10-CM   1. Prostate cancer ([place]) 185 C61     Current Dose: 30.6 Gy  Planned Dose:  68.4 Gy  Narrative The patient presents for routine under treatment assessment.  Weight and vitals stable. Denies pain. Reports nocturia x 2. Denies dysuria or hematuria. Describes a steady urine stream. Reports occasional urgency. Denies incontinence or leakage. Denies diarrhea or rectal irritation. Reports mild fatigue.   The patient is without complaint. Set-up films were reviewed. The chart was checked.  Physical Findings  weight is 246 lb 4.8 oz (111.721 kg). His blood pressure is 132/83 and his pulse is 59. His respiration is 16 and oxygen saturation is 100%. . Weight essentially stable.  No significant changes.  Impression The patient is tolerating radiation.  Plan Continue treatment as planned.         Sheral Apley Tammi Klippel, M.D.  This document serves as a record of services personally performed by [name], MD. It was created on his behalf by Lendon Collar, a trained medical scribe. The creation of this record is based on the scribe's personal observations and the provider's statements to them. This document has been checked and approved by the attending provider.

## 2015-11-23 ENCOUNTER — Ambulatory Visit
Admission: RE | Admit: 2015-11-23 | Discharge: 2015-11-23 | Disposition: A | Discharge status: Home / Self Care | Payer: Medicare HMO | Source: Ambulatory Visit | Attending: Radiation Oncology | Admitting: Radiation Oncology

## 2015-11-23 DIAGNOSIS — Z9889 Other specified postprocedural states: Secondary | ICD-10-CM | POA: Diagnosis not present

## 2015-11-23 DIAGNOSIS — C61 Malignant neoplasm of prostate: Secondary | ICD-10-CM | POA: Diagnosis not present

## 2015-11-23 DIAGNOSIS — Z510 Encounter for antineoplastic radiation therapy: Secondary | ICD-10-CM | POA: Diagnosis not present

## 2015-11-23 DIAGNOSIS — R9721 Rising PSA following treatment for malignant neoplasm of prostate: Secondary | ICD-10-CM | POA: Diagnosis not present

## 2015-11-23 DIAGNOSIS — Z7982 Long term (current) use of aspirin: Secondary | ICD-10-CM | POA: Diagnosis not present

## 2015-11-24 ENCOUNTER — Ambulatory Visit
Admission: RE | Admit: 2015-11-24 | Discharge: 2015-11-24 | Disposition: A | Discharge status: Home / Self Care | Payer: Medicare HMO | Source: Ambulatory Visit | Attending: Radiation Oncology | Admitting: Radiation Oncology

## 2015-11-24 DIAGNOSIS — Z510 Encounter for antineoplastic radiation therapy: Secondary | ICD-10-CM | POA: Diagnosis not present

## 2015-11-24 DIAGNOSIS — R9721 Rising PSA following treatment for malignant neoplasm of prostate: Secondary | ICD-10-CM | POA: Diagnosis not present

## 2015-11-24 DIAGNOSIS — C61 Malignant neoplasm of prostate: Secondary | ICD-10-CM | POA: Diagnosis not present

## 2015-11-24 DIAGNOSIS — Z9889 Other specified postprocedural states: Secondary | ICD-10-CM | POA: Diagnosis not present

## 2015-11-24 DIAGNOSIS — Z7982 Long term (current) use of aspirin: Secondary | ICD-10-CM | POA: Diagnosis not present

## 2015-11-25 ENCOUNTER — Ambulatory Visit: Payer: Medicare HMO

## 2015-11-25 ENCOUNTER — Ambulatory Visit
Admission: RE | Admit: 2015-11-25 | Discharge: 2015-11-25 | Disposition: A | Discharge status: Home / Self Care | Payer: Medicare HMO | Source: Ambulatory Visit | Attending: Radiation Oncology | Admitting: Radiation Oncology

## 2015-11-25 DIAGNOSIS — C61 Malignant neoplasm of prostate: Secondary | ICD-10-CM | POA: Diagnosis not present

## 2015-11-25 DIAGNOSIS — R9721 Rising PSA following treatment for malignant neoplasm of prostate: Secondary | ICD-10-CM | POA: Diagnosis not present

## 2015-11-25 DIAGNOSIS — Z7982 Long term (current) use of aspirin: Secondary | ICD-10-CM | POA: Diagnosis not present

## 2015-11-25 DIAGNOSIS — Z9889 Other specified postprocedural states: Secondary | ICD-10-CM | POA: Diagnosis not present

## 2015-11-25 DIAGNOSIS — Z510 Encounter for antineoplastic radiation therapy: Secondary | ICD-10-CM | POA: Diagnosis not present

## 2015-11-26 ENCOUNTER — Ambulatory Visit
Admission: RE | Admit: 2015-11-26 | Discharge: 2015-11-26 | Disposition: A | Discharge status: Home / Self Care | Payer: Medicare HMO | Source: Ambulatory Visit | Attending: Radiation Oncology | Admitting: Radiation Oncology

## 2015-11-26 ENCOUNTER — Ambulatory Visit: Payer: Medicare HMO

## 2015-11-26 DIAGNOSIS — Z510 Encounter for antineoplastic radiation therapy: Secondary | ICD-10-CM | POA: Diagnosis not present

## 2015-11-26 DIAGNOSIS — Z7982 Long term (current) use of aspirin: Secondary | ICD-10-CM | POA: Diagnosis not present

## 2015-11-26 DIAGNOSIS — C61 Malignant neoplasm of prostate: Secondary | ICD-10-CM | POA: Diagnosis not present

## 2015-11-26 DIAGNOSIS — Z9889 Other specified postprocedural states: Secondary | ICD-10-CM | POA: Diagnosis not present

## 2015-11-26 DIAGNOSIS — R9721 Rising PSA following treatment for malignant neoplasm of prostate: Secondary | ICD-10-CM | POA: Diagnosis not present

## 2015-11-27 ENCOUNTER — Encounter: Payer: Self-pay | Admitting: Radiation Oncology

## 2015-11-27 ENCOUNTER — Ambulatory Visit: Payer: Medicare HMO

## 2015-11-27 ENCOUNTER — Ambulatory Visit
Admission: RE | Admit: 2015-11-27 | Discharge: 2015-11-27 | Disposition: A | Discharge status: Home / Self Care | Payer: Medicare HMO | Source: Ambulatory Visit | Attending: Radiation Oncology | Admitting: Radiation Oncology

## 2015-11-27 VITALS — BP 139/83 | HR 62 | Resp 16 | Wt 245.5 lb

## 2015-11-27 DIAGNOSIS — C61 Malignant neoplasm of prostate: Secondary | ICD-10-CM | POA: Diagnosis not present

## 2015-11-27 DIAGNOSIS — Z510 Encounter for antineoplastic radiation therapy: Secondary | ICD-10-CM | POA: Diagnosis not present

## 2015-11-27 DIAGNOSIS — R9721 Rising PSA following treatment for malignant neoplasm of prostate: Secondary | ICD-10-CM | POA: Diagnosis not present

## 2015-11-27 DIAGNOSIS — Z7982 Long term (current) use of aspirin: Secondary | ICD-10-CM | POA: Diagnosis not present

## 2015-11-27 DIAGNOSIS — Z9889 Other specified postprocedural states: Secondary | ICD-10-CM | POA: Diagnosis not present

## 2015-11-27 NOTE — Progress Notes (Signed)
Weight and vitals stable. Denies pain. Denies dysuria or hematuria. Reports rectal irritation. Understands baby wipes with aloe and tucks wipes would help to soothe rectal irritation. Reports nocturia x 2. Describes a steady urine stream. Reports occasional urgency. Denies incontinence or leakage. Reports fatigue.  BP 139/83 mmHg  Pulse 62  Resp 16  Wt 245 lb 8 oz (111.358 kg)  SpO2 100% Wt Readings from Last 3 Encounters:  11/27/15 245 lb 8 oz (111.358 kg)  11/20/15 246 lb 4.8 oz (111.721 kg)  11/12/15 246 lb 1.6 oz (111.63 kg)

## 2015-11-27 NOTE — Progress Notes (Signed)
   Department of Radiation Oncology  Phone:  [phone] Fax:        [phone]  Weekly Treatment Note    Name: Douglas Nielsen Date: 11/27/2015 MRN: [id number] DOB: [birth year]   Diagnosis:     ICD-9-CM ICD-10-CM   1. Prostate cancer ([place]) 185 C61      Current dose: 39.6 Gy  Current fraction: 22   MEDICATIONS: Current Outpatient Prescriptions  Medication Sig Dispense Refill  . acetaminophen (TYLENOL) 500 MG tablet Take 1,000 mg by mouth every 6 (six) hours as needed for mild pain or moderate pain.    Marland Kitchen aspirin EC 325 MG tablet Take 325 mg by mouth daily.    Marland Kitchen levothyroxine (SYNTHROID, LEVOTHROID) 150 MCG tablet Take 150 mcg by mouth every other day. Alternates between 150 and 175    . levothyroxine (SYNTHROID, LEVOTHROID) 175 MCG tablet Take 175 mcg by mouth every other day. Alternates between 150 and 175    . loratadine (CLARITIN) 10 MG tablet Take 10 mg by mouth daily.    Marland Kitchen losartan (COZAAR) 25 MG tablet Take 25 mg by mouth every evening.    . phenylephrine (NEO-SYNEPHRINE) 1 % nasal spray Place 1 drop into both nostrils every 6 (six) hours as needed for congestion.    . simvastatin (ZOCOR) 40 MG tablet Take 40 mg by mouth every evening.     No current facility-administered medications for this encounter.     ALLERGIES: Lisinopril   LABORATORY DATA:  Lab Results  Component Value Date   WBC 7.2 02/14/2014   HGB 11.1* 02/21/2014   HCT 33.2* 02/21/2014   MCV 85.8 02/14/2014   PLT 190 02/14/2014   Lab Results  Component Value Date   NA 140 02/14/2014   K 4.0 02/14/2014   CL 102 02/14/2014   CO2 25 02/14/2014   No results found for: ALT, AST, GGT, ALKPHOS, BILITOT   NARRATIVE: Douglas Nielsen was seen today for weekly treatment management. The chart was checked and the patient's films were reviewed.  Denies pain, dysuria, or hematuria. Reports rectal irritation. The nurse explained that baby wipes with aloe and tucks wipes would help to soothe rectal  irritation. He had a little diarrhea and took Imodium for it and it has since resolved. Reports nocturia x 2. Describes a steady urine stream. Reports occasional urgency and fatigue. Denies incontinence or leakage.  PHYSICAL EXAMINATION: weight is 245 lb 8 oz (111.358 kg). His blood pressure is 139/83 and his pulse is 62. His respiration is 16 and oxygen saturation is 100%.  Alert and oriented x3.  ASSESSMENT: The patient is doing satisfactorily with treatment.  PLAN: We will continue with the patient's radiation treatment as planned.  This document serves as a record of services personally performed by Kyung Rudd, MD. It was created on his behalf by Darcus Austin, a trained medical scribe. The creation of this record is based on the scribe's personal observations and the provider's statements to them. This document has been checked and approved by the attending provider.

## 2015-12-01 ENCOUNTER — Ambulatory Visit
Admission: RE | Admit: 2015-12-01 | Discharge: 2015-12-01 | Disposition: A | Discharge status: Home / Self Care | Payer: Medicare HMO | Source: Ambulatory Visit | Attending: Radiation Oncology | Admitting: Radiation Oncology

## 2015-12-01 ENCOUNTER — Ambulatory Visit: Payer: Medicare HMO

## 2015-12-01 DIAGNOSIS — C61 Malignant neoplasm of prostate: Secondary | ICD-10-CM | POA: Diagnosis not present

## 2015-12-01 DIAGNOSIS — Z9889 Other specified postprocedural states: Secondary | ICD-10-CM | POA: Diagnosis not present

## 2015-12-01 DIAGNOSIS — Z7982 Long term (current) use of aspirin: Secondary | ICD-10-CM | POA: Diagnosis not present

## 2015-12-01 DIAGNOSIS — Z510 Encounter for antineoplastic radiation therapy: Secondary | ICD-10-CM | POA: Diagnosis not present

## 2015-12-01 DIAGNOSIS — R9721 Rising PSA following treatment for malignant neoplasm of prostate: Secondary | ICD-10-CM | POA: Diagnosis not present

## 2015-12-02 ENCOUNTER — Ambulatory Visit
Admission: RE | Admit: 2015-12-02 | Discharge: 2015-12-02 | Disposition: A | Discharge status: Home / Self Care | Payer: Medicare HMO | Source: Ambulatory Visit | Attending: Radiation Oncology | Admitting: Radiation Oncology

## 2015-12-02 ENCOUNTER — Ambulatory Visit: Payer: Medicare HMO

## 2015-12-02 DIAGNOSIS — Z7982 Long term (current) use of aspirin: Secondary | ICD-10-CM | POA: Diagnosis not present

## 2015-12-02 DIAGNOSIS — R9721 Rising PSA following treatment for malignant neoplasm of prostate: Secondary | ICD-10-CM | POA: Diagnosis not present

## 2015-12-02 DIAGNOSIS — Z510 Encounter for antineoplastic radiation therapy: Secondary | ICD-10-CM | POA: Diagnosis not present

## 2015-12-02 DIAGNOSIS — C61 Malignant neoplasm of prostate: Secondary | ICD-10-CM | POA: Diagnosis not present

## 2015-12-02 DIAGNOSIS — Z9889 Other specified postprocedural states: Secondary | ICD-10-CM | POA: Diagnosis not present

## 2015-12-03 ENCOUNTER — Ambulatory Visit
Admission: RE | Admit: 2015-12-03 | Discharge: 2015-12-03 | Disposition: A | Discharge status: Home / Self Care | Payer: Medicare HMO | Source: Ambulatory Visit | Attending: Radiation Oncology | Admitting: Radiation Oncology

## 2015-12-03 ENCOUNTER — Encounter: Payer: Self-pay | Admitting: Medical Oncology

## 2015-12-03 ENCOUNTER — Ambulatory Visit: Payer: Medicare HMO

## 2015-12-03 DIAGNOSIS — Z9889 Other specified postprocedural states: Secondary | ICD-10-CM | POA: Diagnosis not present

## 2015-12-03 DIAGNOSIS — C61 Malignant neoplasm of prostate: Secondary | ICD-10-CM | POA: Diagnosis not present

## 2015-12-03 DIAGNOSIS — R9721 Rising PSA following treatment for malignant neoplasm of prostate: Secondary | ICD-10-CM | POA: Diagnosis not present

## 2015-12-03 DIAGNOSIS — Z510 Encounter for antineoplastic radiation therapy: Secondary | ICD-10-CM | POA: Diagnosis not present

## 2015-12-03 DIAGNOSIS — Z7982 Long term (current) use of aspirin: Secondary | ICD-10-CM | POA: Diagnosis not present

## 2015-12-03 NOTE — Progress Notes (Signed)
Oncology Nurse Navigator Documentation  Oncology Nurse Navigator Flowsheets 10/27/2015 12/03/2015  Referral date to RadOnc/MedOnc 10/08/2015 -  Navigator Encounter Type Treatment Treatment- Douglas Nielsen states he is doing well with his treatments. No major issues. He will complete his treatments 1/18. I asked him to call me with any questions or concerns. He voiced understanding.  Patient Visit Type Radonc -  Treatment Phase Treatment Treatment  Barriers/Navigation Needs No barriers at this time No barriers at this time  Support Groups/Services Friends and Family Friends and Family  Time Spent with Patient 15 15

## 2015-12-04 ENCOUNTER — Ambulatory Visit
Admission: RE | Admit: 2015-12-04 | Discharge: 2015-12-04 | Disposition: A | Discharge status: Home / Self Care | Payer: Medicare HMO | Source: Ambulatory Visit | Attending: Radiation Oncology | Admitting: Radiation Oncology

## 2015-12-04 ENCOUNTER — Ambulatory Visit: Payer: Medicare HMO

## 2015-12-04 ENCOUNTER — Encounter: Payer: Self-pay | Admitting: Radiation Oncology

## 2015-12-04 VITALS — BP 145/85 | HR 57 | Resp 16 | Wt 247.6 lb

## 2015-12-04 DIAGNOSIS — R9721 Rising PSA following treatment for malignant neoplasm of prostate: Secondary | ICD-10-CM | POA: Diagnosis not present

## 2015-12-04 DIAGNOSIS — Z9889 Other specified postprocedural states: Secondary | ICD-10-CM | POA: Diagnosis not present

## 2015-12-04 DIAGNOSIS — Z7982 Long term (current) use of aspirin: Secondary | ICD-10-CM | POA: Diagnosis not present

## 2015-12-04 DIAGNOSIS — Z510 Encounter for antineoplastic radiation therapy: Secondary | ICD-10-CM | POA: Diagnosis not present

## 2015-12-04 DIAGNOSIS — C61 Malignant neoplasm of prostate: Secondary | ICD-10-CM | POA: Diagnosis not present

## 2015-12-04 NOTE — Progress Notes (Signed)
  Radiation Oncology         [phone]   Name: Douglas Nielsen MRN: [id number]   Date: 12/04/2015  DOB: 1941/08/05     Weekly Radiation Therapy Management    ICD-9-CM ICD-10-CM   1. Prostate cancer ([place]) 185 C61     Current Dose: 46.8 Gy  Planned Dose:  68.4 Gy  Narrative The patient presents for routine under treatment assessment.  Weight and vitals stable. Denies pain. Denies dysuria or hematuria. Reports rectal irritation is no worse with the aid of TUCKS wipes. Reports nocturia x 2. Describes a strong steady urine stream. Reports occasional urgency. Denies incontinence or leakage. Reports mild fatigue. Weight and vitals stable. Denies pain. Denies dysuria or hematuria. Reports rectal irritation is no worse with the aid of TUCKS wipes. Reports nocturia x 2. Describes a strong steady urine stream. Reports occasional urgency. Denies incontinence or leakage. Reports mild fatigue.   The patient is without complaint. Set-up films were reviewed. The chart was checked.  Physical Findings  weight is 247 lb 9.6 oz (112.311 kg). His blood pressure is 145/85 and his pulse is 57. His respiration is 16 and oxygen saturation is 100%. . Weight essentially stable.  No significant changes.  Impression The patient is tolerating radiation.  Plan Continue treatment as planned.         Sheral Apley Tammi Klippel, M.D.  This document serves as a record of services personally performed by [name], MD. It was created on his behalf by Lendon Collar, a trained medical scribe. The creation of this record is based on the scribe's personal observations and the provider's statements to them. This document has been checked and approved by the attending provider.

## 2015-12-04 NOTE — Progress Notes (Signed)
Weight and vitals stable. Denies pain. Denies dysuria or hematuria. Reports rectal irritation is no worse with the aid of TUCKS wipes. Reports nocturia x 2. Describes a strong steady urine stream. Reports occasional urgency. Denies incontinence or leakage. Reports mild fatigue.   BP 145/85 mmHg  Pulse 57  Resp 16  Wt 247 lb 9.6 oz (112.311 kg)  SpO2 100% Wt Readings from Last 3 Encounters:  12/04/15 247 lb 9.6 oz (112.311 kg)  11/27/15 245 lb 8 oz (111.358 kg)  11/20/15 246 lb 4.8 oz (111.721 kg)

## 2015-12-08 ENCOUNTER — Ambulatory Visit
Admission: RE | Admit: 2015-12-08 | Discharge: 2015-12-08 | Disposition: A | Discharge status: Home / Self Care | Payer: Medicare HMO | Source: Ambulatory Visit | Attending: Radiation Oncology | Admitting: Radiation Oncology

## 2015-12-08 DIAGNOSIS — C61 Malignant neoplasm of prostate: Secondary | ICD-10-CM | POA: Diagnosis not present

## 2015-12-08 DIAGNOSIS — Z9889 Other specified postprocedural states: Secondary | ICD-10-CM | POA: Diagnosis not present

## 2015-12-08 DIAGNOSIS — R9721 Rising PSA following treatment for malignant neoplasm of prostate: Secondary | ICD-10-CM | POA: Diagnosis not present

## 2015-12-08 DIAGNOSIS — Z7982 Long term (current) use of aspirin: Secondary | ICD-10-CM | POA: Diagnosis not present

## 2015-12-08 DIAGNOSIS — Z510 Encounter for antineoplastic radiation therapy: Secondary | ICD-10-CM | POA: Diagnosis not present

## 2015-12-09 ENCOUNTER — Ambulatory Visit
Admission: RE | Admit: 2015-12-09 | Discharge: 2015-12-09 | Disposition: A | Discharge status: Home / Self Care | Payer: Medicare HMO | Source: Ambulatory Visit | Attending: Radiation Oncology | Admitting: Radiation Oncology

## 2015-12-09 DIAGNOSIS — Z7982 Long term (current) use of aspirin: Secondary | ICD-10-CM | POA: Diagnosis not present

## 2015-12-09 DIAGNOSIS — R9721 Rising PSA following treatment for malignant neoplasm of prostate: Secondary | ICD-10-CM | POA: Diagnosis not present

## 2015-12-09 DIAGNOSIS — C61 Malignant neoplasm of prostate: Secondary | ICD-10-CM | POA: Diagnosis not present

## 2015-12-09 DIAGNOSIS — Z9889 Other specified postprocedural states: Secondary | ICD-10-CM | POA: Diagnosis not present

## 2015-12-09 DIAGNOSIS — Z510 Encounter for antineoplastic radiation therapy: Secondary | ICD-10-CM | POA: Diagnosis not present

## 2015-12-10 ENCOUNTER — Ambulatory Visit
Admission: RE | Admit: 2015-12-10 | Discharge: 2015-12-10 | Disposition: A | Discharge status: Home / Self Care | Payer: Medicare HMO | Source: Ambulatory Visit | Attending: Radiation Oncology | Admitting: Radiation Oncology

## 2015-12-10 DIAGNOSIS — R9721 Rising PSA following treatment for malignant neoplasm of prostate: Secondary | ICD-10-CM | POA: Diagnosis not present

## 2015-12-10 DIAGNOSIS — Z9889 Other specified postprocedural states: Secondary | ICD-10-CM | POA: Diagnosis not present

## 2015-12-10 DIAGNOSIS — C61 Malignant neoplasm of prostate: Secondary | ICD-10-CM | POA: Diagnosis not present

## 2015-12-10 DIAGNOSIS — Z7982 Long term (current) use of aspirin: Secondary | ICD-10-CM | POA: Diagnosis not present

## 2015-12-10 DIAGNOSIS — Z510 Encounter for antineoplastic radiation therapy: Secondary | ICD-10-CM | POA: Diagnosis not present

## 2015-12-11 ENCOUNTER — Ambulatory Visit
Admission: RE | Admit: 2015-12-11 | Discharge: 2015-12-11 | Disposition: A | Discharge status: Home / Self Care | Payer: Medicare HMO | Source: Ambulatory Visit | Attending: Radiation Oncology | Admitting: Radiation Oncology

## 2015-12-11 ENCOUNTER — Encounter: Payer: Self-pay | Admitting: Radiation Oncology

## 2015-12-11 VITALS — BP 155/89 | HR 67 | Resp 16 | Wt 247.7 lb

## 2015-12-11 DIAGNOSIS — Z9889 Other specified postprocedural states: Secondary | ICD-10-CM | POA: Diagnosis not present

## 2015-12-11 DIAGNOSIS — R9721 Rising PSA following treatment for malignant neoplasm of prostate: Secondary | ICD-10-CM | POA: Diagnosis not present

## 2015-12-11 DIAGNOSIS — C61 Malignant neoplasm of prostate: Secondary | ICD-10-CM

## 2015-12-11 DIAGNOSIS — Z510 Encounter for antineoplastic radiation therapy: Secondary | ICD-10-CM | POA: Diagnosis not present

## 2015-12-11 DIAGNOSIS — Z7982 Long term (current) use of aspirin: Secondary | ICD-10-CM | POA: Diagnosis not present

## 2015-12-11 NOTE — Progress Notes (Signed)
Weight and vitals stable. Denies pain. Denies dysuria or hematuria. Reports rectal irritation is no worse with the aid of TUCKS wipes. Reports nocturia x 3. Describes a strong steady urine stream. Reports occasional urgency. Reports some stress incontinence. Reports mild fatigue.   BP 155/89 mmHg  Pulse 67  Resp 16  Wt 247 lb 11.2 oz (112.356 kg)  SpO2 100% Wt Readings from Last 3 Encounters:  12/11/15 247 lb 11.2 oz (112.356 kg)  12/04/15 247 lb 9.6 oz (112.311 kg)  11/27/15 245 lb 8 oz (111.358 kg)

## 2015-12-11 NOTE — Progress Notes (Signed)
  Radiation Oncology         [phone]   Name: Douglas Nielsen MRN: [id number]   Date: 12/11/2015  DOB: 07/09/1941     Weekly Radiation Therapy Management    ICD-9-CM ICD-10-CM   1. Prostate cancer ([place]) 185 C61     Current Dose: 54 Gy  Planned Dose:  68.4 Gy  Narrative The patient presents for routine under treatment assessment.  Weight and vitals stable. Denies pain. Denies dysuria or hematuria. Reports rectal irritation is no worse with the aid of TUCKS wipes. Reports nocturia x 3. Describes a strong steady urine stream. Reports occasional urgency. Reports some stress incontinence. He wears a pad for this. Reports mild fatigue.   The patient is without complaint. Set-up films were reviewed. The chart was checked.  Physical Findings  weight is 247 lb 11.2 oz (112.356 kg). His blood pressure is 155/89 and his pulse is 67. His respiration is 16 and oxygen saturation is 100%. . Weight essentially stable.  No significant changes.  Impression The patient is tolerating radiation.  Plan Continue treatment as planned.         Sheral Apley Tammi Klippel, M.D.  This document serves as a record of services personally performed by [name], MD. It was created on his behalf by Arlyce Harman, a trained medical scribe. The creation of this record is based on the scribe's personal observations and the provider's statements to them. This document has been checked and approved by the attending provider.

## 2015-12-14 ENCOUNTER — Ambulatory Visit
Admission: RE | Admit: 2015-12-14 | Discharge: 2015-12-14 | Disposition: A | Discharge status: Home / Self Care | Payer: Medicare HMO | Source: Ambulatory Visit | Attending: Radiation Oncology | Admitting: Radiation Oncology

## 2015-12-14 DIAGNOSIS — C61 Malignant neoplasm of prostate: Secondary | ICD-10-CM | POA: Diagnosis not present

## 2015-12-14 DIAGNOSIS — R9721 Rising PSA following treatment for malignant neoplasm of prostate: Secondary | ICD-10-CM | POA: Diagnosis not present

## 2015-12-14 DIAGNOSIS — Z510 Encounter for antineoplastic radiation therapy: Secondary | ICD-10-CM | POA: Diagnosis not present

## 2015-12-14 DIAGNOSIS — Z9889 Other specified postprocedural states: Secondary | ICD-10-CM | POA: Diagnosis not present

## 2015-12-14 DIAGNOSIS — Z7982 Long term (current) use of aspirin: Secondary | ICD-10-CM | POA: Diagnosis not present

## 2015-12-15 ENCOUNTER — Ambulatory Visit
Admission: RE | Admit: 2015-12-15 | Discharge: 2015-12-15 | Disposition: A | Discharge status: Home / Self Care | Payer: Medicare HMO | Source: Ambulatory Visit | Attending: Radiation Oncology | Admitting: Radiation Oncology

## 2015-12-15 DIAGNOSIS — R9721 Rising PSA following treatment for malignant neoplasm of prostate: Secondary | ICD-10-CM | POA: Diagnosis not present

## 2015-12-15 DIAGNOSIS — Z9889 Other specified postprocedural states: Secondary | ICD-10-CM | POA: Diagnosis not present

## 2015-12-15 DIAGNOSIS — Z510 Encounter for antineoplastic radiation therapy: Secondary | ICD-10-CM | POA: Diagnosis not present

## 2015-12-15 DIAGNOSIS — C61 Malignant neoplasm of prostate: Secondary | ICD-10-CM | POA: Diagnosis not present

## 2015-12-15 DIAGNOSIS — Z7982 Long term (current) use of aspirin: Secondary | ICD-10-CM | POA: Diagnosis not present

## 2015-12-16 ENCOUNTER — Ambulatory Visit
Admission: RE | Admit: 2015-12-16 | Discharge: 2015-12-16 | Disposition: A | Discharge status: Home / Self Care | Payer: Medicare HMO | Source: Ambulatory Visit | Attending: Radiation Oncology | Admitting: Radiation Oncology

## 2015-12-16 DIAGNOSIS — C61 Malignant neoplasm of prostate: Secondary | ICD-10-CM | POA: Diagnosis not present

## 2015-12-16 DIAGNOSIS — R9721 Rising PSA following treatment for malignant neoplasm of prostate: Secondary | ICD-10-CM | POA: Diagnosis not present

## 2015-12-16 DIAGNOSIS — Z510 Encounter for antineoplastic radiation therapy: Secondary | ICD-10-CM | POA: Diagnosis not present

## 2015-12-16 DIAGNOSIS — Z7982 Long term (current) use of aspirin: Secondary | ICD-10-CM | POA: Diagnosis not present

## 2015-12-16 DIAGNOSIS — Z9889 Other specified postprocedural states: Secondary | ICD-10-CM | POA: Diagnosis not present

## 2015-12-17 ENCOUNTER — Encounter: Payer: Self-pay | Admitting: Radiation Oncology

## 2015-12-17 ENCOUNTER — Ambulatory Visit
Admission: RE | Admit: 2015-12-17 | Discharge: 2015-12-17 | Disposition: A | Discharge status: Home / Self Care | Payer: Medicare HMO | Source: Ambulatory Visit | Attending: Radiation Oncology | Admitting: Radiation Oncology

## 2015-12-17 VITALS — BP 146/80 | HR 61 | Resp 16 | Wt 247.5 lb

## 2015-12-17 DIAGNOSIS — C61 Malignant neoplasm of prostate: Secondary | ICD-10-CM | POA: Diagnosis not present

## 2015-12-17 DIAGNOSIS — Z510 Encounter for antineoplastic radiation therapy: Secondary | ICD-10-CM | POA: Diagnosis not present

## 2015-12-17 DIAGNOSIS — Z9889 Other specified postprocedural states: Secondary | ICD-10-CM | POA: Diagnosis not present

## 2015-12-17 DIAGNOSIS — Z7982 Long term (current) use of aspirin: Secondary | ICD-10-CM | POA: Diagnosis not present

## 2015-12-17 DIAGNOSIS — R9721 Rising PSA following treatment for malignant neoplasm of prostate: Secondary | ICD-10-CM | POA: Diagnosis not present

## 2015-12-17 NOTE — Progress Notes (Signed)
  Radiation Oncology         [phone]   Name: Douglas Nielsen MRN: [id number]   Date: 12/17/2015  DOB: 1941/01/08     Weekly Radiation Therapy Management    ICD-9-CM ICD-10-CM   1. Prostate cancer ([place]) 185 C61     Current Dose: 61.2 Gy  Planned Dose:  68.4 Gy  Narrative The patient presents for routine under treatment assessment.  Weight stable. BP slightly elevated Denies pain. Denies dysuria or hematuria. Reports rectal irritation is no worse with the aid of TUCKS wipes. Reports nocturia x 3. Describes a strong steady urine stream. Reports occasional urgency. Reports some stress incontinence. Reports he feels more fatigued. One month follow up appointment card given. Patient understands to contact this RN with future needs.  The patient is without complaint. Set-up films were reviewed. The chart was checked.  Physical Findings  weight is 247 lb 8 oz (112.265 kg). His blood pressure is 146/80 and his pulse is 61. His respiration is 16. . Weight essentially stable.  No significant changes.  Impression The patient is tolerating radiation.  Plan Continue treatment as planned. He is planned to finish treatment next week, 12/23/2015. He has an appointment with his urologist in May.         Sheral Apley Tammi Klippel, M.D.  This document serves as a record of services personally performed by [name], MD. It was created on his behalf by Lendon Collar, a trained medical scribe. The creation of this record is based on the scribe's personal observations and the provider's statements to them. This document has been checked and approved by the attending provider.

## 2015-12-17 NOTE — Progress Notes (Addendum)
Weight stable. BP slightly elevated Denies pain. Denies dysuria or hematuria. Reports rectal irritation is no worse with the aid of TUCKS wipes. Reports nocturia x 3. Describes a strong steady urine stream. Reports occasional urgency. Reports some stress incontinence. Reports he feels more fatigued. One month follow up appointment card given. Patient understands to contact this RN with future needs.   BP 146/80 mmHg  Pulse 61  Resp 16  Wt 247 lb 8 oz (112.265 kg) Wt Readings from Last 3 Encounters:  12/17/15 247 lb 8 oz (112.265 kg)  12/11/15 247 lb 11.2 oz (112.356 kg)  12/04/15 247 lb 9.6 oz (112.311 kg)

## 2015-12-18 ENCOUNTER — Ambulatory Visit
Admission: RE | Admit: 2015-12-18 | Discharge: 2015-12-18 | Disposition: A | Discharge status: Home / Self Care | Payer: Medicare HMO | Source: Ambulatory Visit | Attending: Radiation Oncology | Admitting: Radiation Oncology

## 2015-12-18 DIAGNOSIS — R9721 Rising PSA following treatment for malignant neoplasm of prostate: Secondary | ICD-10-CM | POA: Diagnosis not present

## 2015-12-18 DIAGNOSIS — Z9889 Other specified postprocedural states: Secondary | ICD-10-CM | POA: Diagnosis not present

## 2015-12-18 DIAGNOSIS — Z510 Encounter for antineoplastic radiation therapy: Secondary | ICD-10-CM | POA: Diagnosis not present

## 2015-12-18 DIAGNOSIS — C61 Malignant neoplasm of prostate: Secondary | ICD-10-CM | POA: Diagnosis not present

## 2015-12-18 DIAGNOSIS — Z7982 Long term (current) use of aspirin: Secondary | ICD-10-CM | POA: Diagnosis not present

## 2015-12-21 ENCOUNTER — Encounter: Payer: Self-pay | Admitting: Medical Oncology

## 2015-12-21 ENCOUNTER — Ambulatory Visit
Admission: RE | Admit: 2015-12-21 | Discharge: 2015-12-21 | Disposition: A | Discharge status: Home / Self Care | Payer: Medicare HMO | Source: Ambulatory Visit | Attending: Radiation Oncology | Admitting: Radiation Oncology

## 2015-12-21 ENCOUNTER — Encounter: Payer: Self-pay | Admitting: Radiation Oncology

## 2015-12-21 VITALS — BP 155/82 | HR 68 | Resp 16 | Wt 247.6 lb

## 2015-12-21 DIAGNOSIS — C61 Malignant neoplasm of prostate: Secondary | ICD-10-CM

## 2015-12-21 DIAGNOSIS — Z7982 Long term (current) use of aspirin: Secondary | ICD-10-CM | POA: Diagnosis not present

## 2015-12-21 DIAGNOSIS — Z9889 Other specified postprocedural states: Secondary | ICD-10-CM | POA: Diagnosis not present

## 2015-12-21 DIAGNOSIS — R9721 Rising PSA following treatment for malignant neoplasm of prostate: Secondary | ICD-10-CM | POA: Diagnosis not present

## 2015-12-21 DIAGNOSIS — Z510 Encounter for antineoplastic radiation therapy: Secondary | ICD-10-CM | POA: Diagnosis not present

## 2015-12-21 NOTE — Progress Notes (Signed)
Oncology Nurse Navigator Documentation  Oncology Nurse Navigator Flowsheets 10/27/2015 12/03/2015 12/21/2015  Navigator Encounter Type Treatment Treatment Treatment- Pt state he is doing well with only some mild side effects. He has noted he is more fatigued. He will complete his treatments 12/23/15.  Patient Visit Type Radonc - RadOnc  Treatment Phase Treatment Treatment Treatment  Barriers/Navigation Needs No barriers at this time No barriers at this time No barriers at this time  Support Groups/Services Friends and Family Friends and Family Friends and Family  Time Spent with Patient 15 15 15

## 2015-12-21 NOTE — Progress Notes (Signed)
  Radiation Oncology         [phone]   Name: Douglas Nielsen MRN: [id number]   Date: 12/21/2015  DOB: December 15, 1940     Weekly Radiation Therapy Management    ICD-9-CM ICD-10-CM   1. Prostate cancer ([place]) 185 C61     Current Dose: 64.8 Gy  Planned Dose:  68.4 Gy  Narrative The patient presents for routine under treatment assessment.  Weight stable. BP slightly elevated. Denies pain. Denies dysuria or hematuria. Reports rectal irritation is no worse with the aid of TUCKS wipes. Reports nocturia x 3. Describes a strong steady urine stream. Reports occasional urgency. Reports stress incontinence is worse. Reports he feels more fatigued.   Set-up films were reviewed. The chart was checked.  Physical Findings  weight is 247 lb 9.6 oz (112.311 kg). His blood pressure is 155/82 and his pulse is 68. His respiration is 16 and oxygen saturation is 100%. . Weight essentially stable.  No significant changes.  Impression The patient is tolerating radiation.   Plan Continue treatment as planned.          Sheral Apley Tammi Klippel, M.D.  This document serves as a record of services personally performed by [name], MD. It was created on his behalf by Jenell Milliner, a trained medical scribe. The creation of this record is based on the scribe's personal observations and the provider's statements to them. This document has been checked and approved by the attending provider.

## 2015-12-21 NOTE — Progress Notes (Addendum)
Weight stable. BP slightly elevated Denies pain. Denies dysuria or hematuria. Reports rectal irritation is no worse with the aid of TUCKS wipes. Reports nocturia x 3. Describes a strong steady urine stream. Reports occasional urgency. Reports stress incontinence is worse. Reports he feels more fatigued.  BP 155/82 mmHg  Pulse 68  Resp 16  Wt 247 lb 9.6 oz (112.311 kg)  SpO2 100% Wt Readings from Last 3 Encounters:  12/21/15 247 lb 9.6 oz (112.311 kg)  12/17/15 247 lb 8 oz (112.265 kg)  12/11/15 247 lb 11.2 oz (112.356 kg)

## 2015-12-22 ENCOUNTER — Ambulatory Visit
Admission: RE | Admit: 2015-12-22 | Discharge: 2015-12-22 | Disposition: A | Discharge status: Home / Self Care | Payer: Medicare HMO | Source: Ambulatory Visit | Attending: Radiation Oncology | Admitting: Radiation Oncology

## 2015-12-22 ENCOUNTER — Ambulatory Visit: Payer: Medicare HMO

## 2015-12-22 DIAGNOSIS — R9721 Rising PSA following treatment for malignant neoplasm of prostate: Secondary | ICD-10-CM | POA: Diagnosis not present

## 2015-12-22 DIAGNOSIS — Z7982 Long term (current) use of aspirin: Secondary | ICD-10-CM | POA: Diagnosis not present

## 2015-12-22 DIAGNOSIS — Z510 Encounter for antineoplastic radiation therapy: Secondary | ICD-10-CM | POA: Diagnosis not present

## 2015-12-22 DIAGNOSIS — Z9889 Other specified postprocedural states: Secondary | ICD-10-CM | POA: Diagnosis not present

## 2015-12-22 DIAGNOSIS — C61 Malignant neoplasm of prostate: Secondary | ICD-10-CM | POA: Diagnosis not present

## 2015-12-23 ENCOUNTER — Encounter: Payer: Self-pay | Admitting: Medical Oncology

## 2015-12-23 ENCOUNTER — Encounter: Payer: Self-pay | Admitting: Radiation Oncology

## 2015-12-23 ENCOUNTER — Ambulatory Visit: Payer: Medicare HMO | Admitting: Radiation Oncology

## 2015-12-23 ENCOUNTER — Ambulatory Visit
Admission: RE | Admit: 2015-12-23 | Discharge: 2015-12-23 | Disposition: A | Discharge status: Home / Self Care | Payer: Medicare HMO | Source: Ambulatory Visit | Attending: Radiation Oncology | Admitting: Radiation Oncology

## 2015-12-23 DIAGNOSIS — Z510 Encounter for antineoplastic radiation therapy: Secondary | ICD-10-CM | POA: Diagnosis not present

## 2015-12-23 DIAGNOSIS — R9721 Rising PSA following treatment for malignant neoplasm of prostate: Secondary | ICD-10-CM | POA: Diagnosis not present

## 2015-12-23 DIAGNOSIS — Z9889 Other specified postprocedural states: Secondary | ICD-10-CM | POA: Diagnosis not present

## 2015-12-23 DIAGNOSIS — Z7982 Long term (current) use of aspirin: Secondary | ICD-10-CM | POA: Diagnosis not present

## 2015-12-23 DIAGNOSIS — C61 Malignant neoplasm of prostate: Secondary | ICD-10-CM | POA: Diagnosis not present

## 2015-12-23 NOTE — Progress Notes (Signed)
Oncology Nurse Navigator Documentation  Oncology Nurse Navigator Flowsheets 12/03/2015 12/21/2015 12/23/2015  Navigator Encounter Type Treatment Treatment Treatment  Patient Visit Type - RadOnc -  Treatment Phase Treatment Treatment Final Radiation Tx- Douglas Nielsen celebrated the completion of radiation treatments  by ringing the bell. He stated he  is happy to be finished. He will follow up with Douglas Nielsen 01/28/16. I asked him to call me with any questions or concerns. He voiced understanding.  Barriers/Navigation Needs No barriers at this time No barriers at this time No barriers at this time  Support Groups/Services Friends and Family Friends and Family Friends and Family  Time Spent with Patient 15 15 15

## 2015-12-25 ENCOUNTER — Ambulatory Visit: Payer: Medicare HMO | Admitting: Radiation Oncology

## 2015-12-25 ENCOUNTER — Ambulatory Visit
Admission: RE | Admit: 2015-12-25 | Discharge: 2015-12-25 | Disposition: A | Discharge status: Home / Self Care | Payer: Medicare HMO | Source: Ambulatory Visit | Attending: Radiation Oncology | Admitting: Radiation Oncology

## 2015-12-29 DIAGNOSIS — E782 Mixed hyperlipidemia: Secondary | ICD-10-CM | POA: Diagnosis not present

## 2015-12-29 DIAGNOSIS — H9190 Unspecified hearing loss, unspecified ear: Secondary | ICD-10-CM | POA: Diagnosis not present

## 2015-12-29 DIAGNOSIS — M6208 Separation of muscle (nontraumatic), other site: Secondary | ICD-10-CM | POA: Diagnosis not present

## 2015-12-29 DIAGNOSIS — J309 Allergic rhinitis, unspecified: Secondary | ICD-10-CM | POA: Diagnosis not present

## 2015-12-29 DIAGNOSIS — C61 Malignant neoplasm of prostate: Secondary | ICD-10-CM | POA: Diagnosis not present

## 2015-12-29 DIAGNOSIS — I10 Essential (primary) hypertension: Secondary | ICD-10-CM | POA: Diagnosis not present

## 2015-12-29 DIAGNOSIS — Z1211 Encounter for screening for malignant neoplasm of colon: Secondary | ICD-10-CM | POA: Diagnosis not present

## 2015-12-29 DIAGNOSIS — E039 Hypothyroidism, unspecified: Secondary | ICD-10-CM | POA: Diagnosis not present

## 2015-12-29 DIAGNOSIS — Z0000 Encounter for general adult medical examination without abnormal findings: Secondary | ICD-10-CM | POA: Diagnosis not present

## 2015-12-29 DIAGNOSIS — R7303 Prediabetes: Secondary | ICD-10-CM | POA: Diagnosis not present

## 2015-12-29 DIAGNOSIS — R7309 Other abnormal glucose: Secondary | ICD-10-CM | POA: Diagnosis not present

## 2016-01-15 NOTE — Progress Notes (Signed)
  Radiation Oncology         [phone] ________________________________  Name: Douglas Nielsen MRN: [id number]  Date: 12/23/2015  DOB: 07-03-1941  End of Treatment Note   ICD-9-CM ICD-10-CM    1. Prostate cancer ([place]) 185 C61     DIAGNOSIS: 75 yo gentleman with stage pT3 adenocarcinoma of the prostate with a Gleason score of 4+3 and a prost-prostatectomy PSA of 0.24     Indication for treatment:  Curative, Salvage Prostatic Fossa Radiotherapy       Radiation treatment dates:   10/27/2015-12/23/2015  Site/dose:   The prostatic fossa was treated to 68.4 Gy in 38 fractions of 1.8 Gy  Beams/energy:   The prostatic fossa was treated using helical intensity modulated radiotherapy delivering 6 megavolt photons. Image guidance was performed with megavoltage CT studies prior to each fraction. He was immobilized with a body fix lower extremity mold.  Narrative: The patient tolerated radiation treatment relatively well.  Denies pain. Denies dysuria or hematuria. Reports rectal irritation is no worse with the aid of TUCKS wipes. Reports nocturia x 3. Describes a strong steady urine stream. Reports occasional urgency. Reports stress incontinence is worse. Reports he feels more fatigued.   Plan: The patient has completed radiation treatment. He will return to radiation oncology clinic for routine followup in one month. I advised him to call or return sooner if he has any questions or concerns related to his recovery or treatment. ________________________________  Sheral Apley. Tammi Klippel, M.D.  This document serves as a record of services personally performed by [name], MD. It was created on his behalf by Arlyce Harman, a trained medical scribe. The creation of this record is based on the scribe's personal observations and the provider's statements to them. This document has been checked and approved by the attending provider.

## 2016-01-28 ENCOUNTER — Ambulatory Visit
Admission: RE | Admit: 2016-01-28 | Discharge: 2016-01-28 | Disposition: A | Discharge status: Home / Self Care | Payer: Medicare HMO | Source: Ambulatory Visit | Attending: Radiation Oncology | Admitting: Radiation Oncology

## 2016-01-28 ENCOUNTER — Encounter: Payer: Self-pay | Admitting: Medical Oncology

## 2016-01-28 ENCOUNTER — Encounter: Payer: Self-pay | Admitting: Radiation Oncology

## 2016-01-28 VITALS — BP 161/80 | HR 64 | Temp 97.8°F | Resp 20 | Ht 70.0 in | Wt 250.5 lb

## 2016-01-28 DIAGNOSIS — C61 Malignant neoplasm of prostate: Secondary | ICD-10-CM

## 2016-01-28 NOTE — Progress Notes (Signed)
Radiation Oncology         [phone] ________________________________  Name: Douglas Nielsen MRN: [id number]  Date: 01/28/2016  DOB: 1941-11-27  Follow-Up Visit Note  CC: Gara Kroner, MD  Raynelle Bring, MD  Diagnosis:   75 yo gentleman with stage pT3 adenocarcinoma of the prostate with a Gleason score of 4+3 and a prost-prostatectomy PSA of 0.24  Interval Since Last Radiation:  1 month;10/27/2015 -01/02/2016  Narrative:  The patient returns today for routine 1 month follow up after completing radiation on 12/23/15.   Review of systems the patient reports that he is doing extremely well overall, but has still experiencing some systems of urgency, nocturia 2 each evening, and feeling history Ms. weaker. He states his energy level is improving. He continues to have low back pain, and denies any difficulty with bowel function at this time. He is not experiencing chest pain, shortness of breath, fevers or chills. A complete review of systems is obtained and is otherwise negative.                               ALLERGIES:  is allergic to lisinopril.  Meds: Current Outpatient Prescriptions  Medication Sig Dispense Refill  . aspirin EC 325 MG tablet Take 325 mg by mouth daily.    Marland Kitchen levothyroxine (SYNTHROID, LEVOTHROID) 150 MCG tablet Take 150 mcg by mouth every other day. Alternates between 150 and 175    . levothyroxine (SYNTHROID, LEVOTHROID) 175 MCG tablet Take 175 mcg by mouth every other day. Alternates between 150 and 175    . losartan (COZAAR) 25 MG tablet Take 25 mg by mouth every evening.    . phenylephrine (NEO-SYNEPHRINE) 1 % nasal spray Place 1 drop into both nostrils every 6 (six) hours as needed for congestion.    . simvastatin (ZOCOR) 40 MG tablet Take 40 mg by mouth every evening.    Marland Kitchen acetaminophen (TYLENOL) 500 MG tablet Take 1,000 mg by mouth every 6 (six) hours as needed for mild pain or moderate pain. Reported on 01/28/2016    . loratadine (CLARITIN) 10 MG tablet Take 10  mg by mouth daily. Reported on 01/28/2016     No current facility-administered medications for this encounter.    Physical Findings:  height is 5\' 10"  (1.778 m) and weight is 250 lb 8 oz (113.626 kg). His oral temperature is 97.8 F (36.6 C). His blood pressure is 161/80 and his pulse is 64. His respiration is 20.   Pain scale 0/10 In general this is a well-appearing Caucasian male in no acute distress. He is alert and oriented 4 and appropriate throughout the examination. Cardiopulmonary assessment reveals a normal effort and no evidence of distress.  Lab Findings: Lab Results  Component Value Date   WBC 7.2 02/14/2014   HGB 11.1* 02/21/2014   HCT 33.2* 02/21/2014   PLT 190 02/14/2014    Lab Results  Component Value Date   NA 140 02/14/2014   K 4.0 02/14/2014   CO2 25 02/14/2014   GLUCOSE 91 02/14/2014   BUN 17 02/14/2014   CREATININE 1.05 02/14/2014   CALCIUM 9.5 02/14/2014    Radiographic Findings: No results found.  Impression:  The patient is recovering from the effects of radiation.    Plan:  We discussed the rationale for the patient to return to Dr. Lynne Logan care, and he has plans to be seen in April for a PSA and follow-up visit with Dr.  Borden thereafter. We have enjoyed caring for the patient and would be happy to see him back in the future if he should need our attention.  Carola Rhine, PAC  This document serves as a record of services personally performed by Shona Simpson, PA and [name], MD. It was created on their behalf by Jenell Milliner, a trained medical scribe. The creation of this record is based on the scribe's personal observations and the provider's statements to them. This document has been checked and approved by the attending provider.

## 2016-01-28 NOTE — Progress Notes (Signed)
Follow up s/p prostate radaiation completed 12/23/15,  Still has urgency, and volume not as strong, nocturia x2,  No hematuria,  Appetite good, energy level getting better chronic low back pain  Mild after getting up and moving BP 161/80 mmHg  Pulse 64  Temp(Src) 97.8 F (36.6 C) (Oral)  Resp 20  Ht 5\' 10"  (1.778 m)  Wt 250 lb 8 oz (113.626 kg)  BMI 35.94 kg/m2  Wt Readings from Last 3 Encounters:  01/28/16 250 lb 8 oz (113.626 kg)  12/21/15 247 lb 9.6 oz (112.311 kg)  12/17/15 247 lb 8 oz (112.265 kg)

## 2016-01-28 NOTE — Progress Notes (Signed)
Oncology Nurse Navigator Documentation  Oncology Nurse Navigator Flowsheets 12/21/2015 12/23/2015 01/28/2016  Navigator Encounter Type Treatment Treatment Follow-up Appt- [name] states he is doing pretty well. He states his urinary symptoms have improved but still having some urgency and nocturia x 2. He states that he get tired easily. We discussed this is normal post radiation but begin to increase his activity and it will continue to improve. He has labs late April and a follow up with Dr. Alinda Money the first week of May. He was encouarged to call with any questions or concerns. He voiced understanding.  Patient Visit Type RadOnc - -  Treatment Phase Treatment Final Radiation Tx -  Barriers/Navigation Needs No barriers at this time No barriers at this time Family concerns  Interventions - - Education Method  Education Method - - Teach-back;Verbal  Support Groups/Services Friends and Family Friends and Family Friends and Family  Acuity - - Level 1  Acuity Level 1 - - Minimal follow up required  Time Spent with Patient 15 15 15

## 2016-03-30 DIAGNOSIS — C61 Malignant neoplasm of prostate: Secondary | ICD-10-CM | POA: Diagnosis not present

## 2016-04-06 DIAGNOSIS — Z0000 Encounter for general adult medical examination without abnormal findings: Secondary | ICD-10-CM | POA: Diagnosis not present

## 2016-04-06 DIAGNOSIS — N5201 Erectile dysfunction due to arterial insufficiency: Secondary | ICD-10-CM | POA: Diagnosis not present

## 2016-04-06 DIAGNOSIS — C61 Malignant neoplasm of prostate: Secondary | ICD-10-CM | POA: Diagnosis not present

## 2016-04-25 DIAGNOSIS — H5203 Hypermetropia, bilateral: Secondary | ICD-10-CM | POA: Diagnosis not present

## 2016-04-25 DIAGNOSIS — H52209 Unspecified astigmatism, unspecified eye: Secondary | ICD-10-CM | POA: Diagnosis not present

## 2016-04-25 DIAGNOSIS — H5213 Myopia, bilateral: Secondary | ICD-10-CM | POA: Diagnosis not present

## 2016-04-25 DIAGNOSIS — Z0100 Encounter for examination of eyes and vision without abnormal findings: Secondary | ICD-10-CM | POA: Diagnosis not present

## 2016-04-25 DIAGNOSIS — H524 Presbyopia: Secondary | ICD-10-CM | POA: Diagnosis not present

## 2016-05-31 DIAGNOSIS — Z1211 Encounter for screening for malignant neoplasm of colon: Secondary | ICD-10-CM | POA: Diagnosis not present

## 2016-05-31 DIAGNOSIS — K641 Second degree hemorrhoids: Secondary | ICD-10-CM | POA: Diagnosis not present

## 2016-06-28 DIAGNOSIS — J309 Allergic rhinitis, unspecified: Secondary | ICD-10-CM | POA: Diagnosis not present

## 2016-06-28 DIAGNOSIS — E039 Hypothyroidism, unspecified: Secondary | ICD-10-CM | POA: Diagnosis not present

## 2016-06-28 DIAGNOSIS — C61 Malignant neoplasm of prostate: Secondary | ICD-10-CM | POA: Diagnosis not present

## 2016-06-28 DIAGNOSIS — R7301 Impaired fasting glucose: Secondary | ICD-10-CM | POA: Diagnosis not present

## 2016-06-28 DIAGNOSIS — I10 Essential (primary) hypertension: Secondary | ICD-10-CM | POA: Diagnosis not present

## 2016-06-28 DIAGNOSIS — E782 Mixed hyperlipidemia: Secondary | ICD-10-CM | POA: Diagnosis not present

## 2016-10-14 DIAGNOSIS — R69 Illness, unspecified: Secondary | ICD-10-CM | POA: Diagnosis not present

## 2016-10-17 DIAGNOSIS — R69 Illness, unspecified: Secondary | ICD-10-CM | POA: Diagnosis not present

## 2016-10-25 DIAGNOSIS — R69 Illness, unspecified: Secondary | ICD-10-CM | POA: Diagnosis not present

## 2016-12-23 DIAGNOSIS — C61 Malignant neoplasm of prostate: Secondary | ICD-10-CM | POA: Diagnosis not present

## 2016-12-30 DIAGNOSIS — C61 Malignant neoplasm of prostate: Secondary | ICD-10-CM | POA: Diagnosis not present

## 2017-01-12 DIAGNOSIS — R7303 Prediabetes: Secondary | ICD-10-CM | POA: Diagnosis not present

## 2017-01-12 DIAGNOSIS — H6120 Impacted cerumen, unspecified ear: Secondary | ICD-10-CM | POA: Diagnosis not present

## 2017-01-12 DIAGNOSIS — C61 Malignant neoplasm of prostate: Secondary | ICD-10-CM | POA: Diagnosis not present

## 2017-01-12 DIAGNOSIS — E782 Mixed hyperlipidemia: Secondary | ICD-10-CM | POA: Diagnosis not present

## 2017-01-12 DIAGNOSIS — Z0000 Encounter for general adult medical examination without abnormal findings: Secondary | ICD-10-CM | POA: Diagnosis not present

## 2017-01-12 DIAGNOSIS — E039 Hypothyroidism, unspecified: Secondary | ICD-10-CM | POA: Diagnosis not present

## 2017-01-12 DIAGNOSIS — J309 Allergic rhinitis, unspecified: Secondary | ICD-10-CM | POA: Diagnosis not present

## 2017-01-12 DIAGNOSIS — I10 Essential (primary) hypertension: Secondary | ICD-10-CM | POA: Diagnosis not present

## 2017-01-12 DIAGNOSIS — Z1211 Encounter for screening for malignant neoplasm of colon: Secondary | ICD-10-CM | POA: Diagnosis not present

## 2017-03-30 DIAGNOSIS — C61 Malignant neoplasm of prostate: Secondary | ICD-10-CM | POA: Diagnosis not present

## 2017-04-06 DIAGNOSIS — C61 Malignant neoplasm of prostate: Secondary | ICD-10-CM | POA: Diagnosis not present

## 2017-06-23 DIAGNOSIS — C61 Malignant neoplasm of prostate: Secondary | ICD-10-CM | POA: Diagnosis not present

## 2017-06-30 DIAGNOSIS — C61 Malignant neoplasm of prostate: Secondary | ICD-10-CM | POA: Diagnosis not present

## 2017-07-06 DIAGNOSIS — H524 Presbyopia: Secondary | ICD-10-CM | POA: Diagnosis not present

## 2017-07-06 DIAGNOSIS — B5801 Toxoplasma chorioretinitis: Secondary | ICD-10-CM | POA: Diagnosis not present

## 2017-07-17 DIAGNOSIS — E782 Mixed hyperlipidemia: Secondary | ICD-10-CM | POA: Diagnosis not present

## 2017-07-17 DIAGNOSIS — J309 Allergic rhinitis, unspecified: Secondary | ICD-10-CM | POA: Diagnosis not present

## 2017-07-17 DIAGNOSIS — Z6836 Body mass index (BMI) 36.0-36.9, adult: Secondary | ICD-10-CM | POA: Diagnosis not present

## 2017-07-17 DIAGNOSIS — C61 Malignant neoplasm of prostate: Secondary | ICD-10-CM | POA: Diagnosis not present

## 2017-07-17 DIAGNOSIS — R7303 Prediabetes: Secondary | ICD-10-CM | POA: Diagnosis not present

## 2017-07-17 DIAGNOSIS — E039 Hypothyroidism, unspecified: Secondary | ICD-10-CM | POA: Diagnosis not present

## 2017-07-17 DIAGNOSIS — R69 Illness, unspecified: Secondary | ICD-10-CM | POA: Diagnosis not present

## 2017-07-17 DIAGNOSIS — I10 Essential (primary) hypertension: Secondary | ICD-10-CM | POA: Diagnosis not present

## 2017-07-26 DIAGNOSIS — R69 Illness, unspecified: Secondary | ICD-10-CM | POA: Diagnosis not present

## 2017-09-28 DIAGNOSIS — C61 Malignant neoplasm of prostate: Secondary | ICD-10-CM | POA: Diagnosis not present

## 2017-10-02 DIAGNOSIS — R69 Illness, unspecified: Secondary | ICD-10-CM | POA: Diagnosis not present

## 2017-10-05 DIAGNOSIS — C61 Malignant neoplasm of prostate: Secondary | ICD-10-CM | POA: Diagnosis not present

## 2017-10-17 DIAGNOSIS — R69 Illness, unspecified: Secondary | ICD-10-CM | POA: Diagnosis not present

## 2017-12-01 ENCOUNTER — Other Ambulatory Visit (HOSPITAL_COMMUNITY): Payer: Self-pay | Admitting: Urology

## 2017-12-01 DIAGNOSIS — C61 Malignant neoplasm of prostate: Secondary | ICD-10-CM

## 2018-01-04 ENCOUNTER — Encounter (HOSPITAL_COMMUNITY)
Admission: RE | Admit: 2018-01-04 | Discharge: 2018-01-04 | Disposition: A | Discharge status: Home / Self Care | Payer: Medicare HMO | Source: Ambulatory Visit | Attending: Urology | Admitting: Urology

## 2018-01-04 DIAGNOSIS — C61 Malignant neoplasm of prostate: Secondary | ICD-10-CM | POA: Diagnosis not present

## 2018-01-04 MED ORDER — TECHNETIUM TC 99M MEDRONATE IV KIT
25.0000 | PACK | Freq: Once | INTRAVENOUS | Status: AC | PRN
  Administered 2018-01-04: 21.9 via INTRAVENOUS

## 2018-01-10 DIAGNOSIS — C61 Malignant neoplasm of prostate: Secondary | ICD-10-CM | POA: Diagnosis not present

## 2018-01-22 DIAGNOSIS — E039 Hypothyroidism, unspecified: Secondary | ICD-10-CM | POA: Diagnosis not present

## 2018-01-22 DIAGNOSIS — I10 Essential (primary) hypertension: Secondary | ICD-10-CM | POA: Diagnosis not present

## 2018-01-22 DIAGNOSIS — C61 Malignant neoplasm of prostate: Secondary | ICD-10-CM | POA: Diagnosis not present

## 2018-01-22 DIAGNOSIS — E782 Mixed hyperlipidemia: Secondary | ICD-10-CM | POA: Diagnosis not present

## 2018-01-22 DIAGNOSIS — Z0000 Encounter for general adult medical examination without abnormal findings: Secondary | ICD-10-CM | POA: Diagnosis not present

## 2018-01-22 DIAGNOSIS — Z5111 Encounter for antineoplastic chemotherapy: Secondary | ICD-10-CM | POA: Diagnosis not present

## 2018-01-22 DIAGNOSIS — J309 Allergic rhinitis, unspecified: Secondary | ICD-10-CM | POA: Diagnosis not present

## 2018-01-22 DIAGNOSIS — R69 Illness, unspecified: Secondary | ICD-10-CM | POA: Diagnosis not present

## 2018-01-22 DIAGNOSIS — R7303 Prediabetes: Secondary | ICD-10-CM | POA: Diagnosis not present

## 2018-03-27 DIAGNOSIS — C61 Malignant neoplasm of prostate: Secondary | ICD-10-CM | POA: Diagnosis not present

## 2018-05-14 DIAGNOSIS — C61 Malignant neoplasm of prostate: Secondary | ICD-10-CM | POA: Diagnosis not present

## 2018-05-23 DIAGNOSIS — C61 Malignant neoplasm of prostate: Secondary | ICD-10-CM | POA: Diagnosis not present

## 2018-07-23 DIAGNOSIS — R69 Illness, unspecified: Secondary | ICD-10-CM | POA: Diagnosis not present

## 2018-07-23 DIAGNOSIS — E039 Hypothyroidism, unspecified: Secondary | ICD-10-CM | POA: Diagnosis not present

## 2018-07-23 DIAGNOSIS — J309 Allergic rhinitis, unspecified: Secondary | ICD-10-CM | POA: Diagnosis not present

## 2018-07-23 DIAGNOSIS — C61 Malignant neoplasm of prostate: Secondary | ICD-10-CM | POA: Diagnosis not present

## 2018-07-23 DIAGNOSIS — I10 Essential (primary) hypertension: Secondary | ICD-10-CM | POA: Diagnosis not present

## 2018-07-23 DIAGNOSIS — E782 Mixed hyperlipidemia: Secondary | ICD-10-CM | POA: Diagnosis not present

## 2018-07-23 DIAGNOSIS — R7303 Prediabetes: Secondary | ICD-10-CM | POA: Diagnosis not present

## 2018-07-23 DIAGNOSIS — Z6835 Body mass index (BMI) 35.0-35.9, adult: Secondary | ICD-10-CM | POA: Diagnosis not present

## 2018-08-27 DIAGNOSIS — Z85828 Personal history of other malignant neoplasm of skin: Secondary | ICD-10-CM | POA: Diagnosis not present

## 2018-08-27 DIAGNOSIS — D225 Melanocytic nevi of trunk: Secondary | ICD-10-CM | POA: Diagnosis not present

## 2018-08-27 DIAGNOSIS — D1801 Hemangioma of skin and subcutaneous tissue: Secondary | ICD-10-CM | POA: Diagnosis not present

## 2018-08-27 DIAGNOSIS — C4441 Basal cell carcinoma of skin of scalp and neck: Secondary | ICD-10-CM | POA: Diagnosis not present

## 2018-08-27 DIAGNOSIS — L821 Other seborrheic keratosis: Secondary | ICD-10-CM | POA: Diagnosis not present

## 2018-08-27 DIAGNOSIS — L570 Actinic keratosis: Secondary | ICD-10-CM | POA: Diagnosis not present

## 2018-09-26 DIAGNOSIS — C61 Malignant neoplasm of prostate: Secondary | ICD-10-CM | POA: Diagnosis not present

## 2018-10-03 DIAGNOSIS — H6980 Other specified disorders of Eustachian tube, unspecified ear: Secondary | ICD-10-CM | POA: Diagnosis not present

## 2018-12-24 DIAGNOSIS — C61 Malignant neoplasm of prostate: Secondary | ICD-10-CM | POA: Diagnosis not present

## 2019-02-21 DIAGNOSIS — Z0000 Encounter for general adult medical examination without abnormal findings: Secondary | ICD-10-CM | POA: Diagnosis not present

## 2019-02-21 DIAGNOSIS — E039 Hypothyroidism, unspecified: Secondary | ICD-10-CM | POA: Diagnosis not present

## 2019-02-21 DIAGNOSIS — I10 Essential (primary) hypertension: Secondary | ICD-10-CM | POA: Diagnosis not present

## 2019-02-21 DIAGNOSIS — R7303 Prediabetes: Secondary | ICD-10-CM | POA: Diagnosis not present

## 2019-02-21 DIAGNOSIS — J309 Allergic rhinitis, unspecified: Secondary | ICD-10-CM | POA: Diagnosis not present

## 2019-02-21 DIAGNOSIS — E782 Mixed hyperlipidemia: Secondary | ICD-10-CM | POA: Diagnosis not present

## 2019-02-21 DIAGNOSIS — R69 Illness, unspecified: Secondary | ICD-10-CM | POA: Diagnosis not present

## 2019-02-21 DIAGNOSIS — Z1211 Encounter for screening for malignant neoplasm of colon: Secondary | ICD-10-CM | POA: Diagnosis not present

## 2019-02-21 DIAGNOSIS — C61 Malignant neoplasm of prostate: Secondary | ICD-10-CM | POA: Diagnosis not present

## 2019-03-22 DIAGNOSIS — C61 Malignant neoplasm of prostate: Secondary | ICD-10-CM | POA: Diagnosis not present

## 2019-03-29 DIAGNOSIS — C61 Malignant neoplasm of prostate: Secondary | ICD-10-CM | POA: Diagnosis not present

## 2019-07-05 DIAGNOSIS — C61 Malignant neoplasm of prostate: Secondary | ICD-10-CM | POA: Diagnosis not present

## 2019-08-29 DIAGNOSIS — R69 Illness, unspecified: Secondary | ICD-10-CM | POA: Diagnosis not present

## 2019-08-29 DIAGNOSIS — J309 Allergic rhinitis, unspecified: Secondary | ICD-10-CM | POA: Diagnosis not present

## 2019-08-29 DIAGNOSIS — I10 Essential (primary) hypertension: Secondary | ICD-10-CM | POA: Diagnosis not present

## 2019-08-29 DIAGNOSIS — E782 Mixed hyperlipidemia: Secondary | ICD-10-CM | POA: Diagnosis not present

## 2019-08-29 DIAGNOSIS — E039 Hypothyroidism, unspecified: Secondary | ICD-10-CM | POA: Diagnosis not present

## 2019-08-29 DIAGNOSIS — C61 Malignant neoplasm of prostate: Secondary | ICD-10-CM | POA: Diagnosis not present

## 2019-08-29 DIAGNOSIS — R7303 Prediabetes: Secondary | ICD-10-CM | POA: Diagnosis not present

## 2019-10-23 DIAGNOSIS — C61 Malignant neoplasm of prostate: Secondary | ICD-10-CM | POA: Diagnosis not present

## 2019-10-30 DIAGNOSIS — C61 Malignant neoplasm of prostate: Secondary | ICD-10-CM | POA: Diagnosis not present

## 2019-12-10 DIAGNOSIS — C61 Malignant neoplasm of prostate: Secondary | ICD-10-CM | POA: Diagnosis not present

## 2019-12-10 DIAGNOSIS — I10 Essential (primary) hypertension: Secondary | ICD-10-CM | POA: Diagnosis not present

## 2019-12-10 DIAGNOSIS — E782 Mixed hyperlipidemia: Secondary | ICD-10-CM | POA: Diagnosis not present

## 2019-12-10 DIAGNOSIS — N183 Chronic kidney disease, stage 3 unspecified: Secondary | ICD-10-CM | POA: Diagnosis not present

## 2019-12-10 DIAGNOSIS — E039 Hypothyroidism, unspecified: Secondary | ICD-10-CM | POA: Diagnosis not present

## 2020-01-27 DIAGNOSIS — C61 Malignant neoplasm of prostate: Secondary | ICD-10-CM | POA: Diagnosis not present

## 2020-01-30 DIAGNOSIS — C61 Malignant neoplasm of prostate: Secondary | ICD-10-CM | POA: Diagnosis not present

## 2020-01-30 DIAGNOSIS — Z5111 Encounter for antineoplastic chemotherapy: Secondary | ICD-10-CM | POA: Diagnosis not present

## 2020-02-04 DIAGNOSIS — I10 Essential (primary) hypertension: Secondary | ICD-10-CM | POA: Diagnosis not present

## 2020-02-04 DIAGNOSIS — H524 Presbyopia: Secondary | ICD-10-CM | POA: Diagnosis not present

## 2020-02-04 DIAGNOSIS — N183 Chronic kidney disease, stage 3 unspecified: Secondary | ICD-10-CM | POA: Diagnosis not present

## 2020-02-04 DIAGNOSIS — C61 Malignant neoplasm of prostate: Secondary | ICD-10-CM | POA: Diagnosis not present

## 2020-02-04 DIAGNOSIS — H2513 Age-related nuclear cataract, bilateral: Secondary | ICD-10-CM | POA: Diagnosis not present

## 2020-02-04 DIAGNOSIS — H43392 Other vitreous opacities, left eye: Secondary | ICD-10-CM | POA: Diagnosis not present

## 2020-02-04 DIAGNOSIS — B5801 Toxoplasma chorioretinitis: Secondary | ICD-10-CM | POA: Diagnosis not present

## 2020-02-04 DIAGNOSIS — E039 Hypothyroidism, unspecified: Secondary | ICD-10-CM | POA: Diagnosis not present

## 2020-02-04 DIAGNOSIS — E782 Mixed hyperlipidemia: Secondary | ICD-10-CM | POA: Diagnosis not present

## 2020-02-04 DIAGNOSIS — H52223 Regular astigmatism, bilateral: Secondary | ICD-10-CM | POA: Diagnosis not present

## 2020-02-25 DIAGNOSIS — Z23 Encounter for immunization: Secondary | ICD-10-CM | POA: Diagnosis not present

## 2020-02-25 DIAGNOSIS — Z0000 Encounter for general adult medical examination without abnormal findings: Secondary | ICD-10-CM | POA: Diagnosis not present

## 2020-02-25 DIAGNOSIS — Z1389 Encounter for screening for other disorder: Secondary | ICD-10-CM | POA: Diagnosis not present

## 2020-02-25 DIAGNOSIS — I10 Essential (primary) hypertension: Secondary | ICD-10-CM | POA: Diagnosis not present

## 2020-02-25 DIAGNOSIS — C61 Malignant neoplasm of prostate: Secondary | ICD-10-CM | POA: Diagnosis not present

## 2020-02-25 DIAGNOSIS — E782 Mixed hyperlipidemia: Secondary | ICD-10-CM | POA: Diagnosis not present

## 2020-02-25 DIAGNOSIS — R7303 Prediabetes: Secondary | ICD-10-CM | POA: Diagnosis not present

## 2020-02-25 DIAGNOSIS — J309 Allergic rhinitis, unspecified: Secondary | ICD-10-CM | POA: Diagnosis not present

## 2020-02-25 DIAGNOSIS — E039 Hypothyroidism, unspecified: Secondary | ICD-10-CM | POA: Diagnosis not present

## 2020-03-19 DIAGNOSIS — Z0001 Encounter for general adult medical examination with abnormal findings: Secondary | ICD-10-CM | POA: Diagnosis not present

## 2020-04-29 DIAGNOSIS — C61 Malignant neoplasm of prostate: Secondary | ICD-10-CM | POA: Diagnosis not present

## 2020-05-06 DIAGNOSIS — C61 Malignant neoplasm of prostate: Secondary | ICD-10-CM | POA: Diagnosis not present

## 2020-05-14 DIAGNOSIS — E785 Hyperlipidemia, unspecified: Secondary | ICD-10-CM | POA: Diagnosis not present

## 2020-05-14 DIAGNOSIS — R69 Illness, unspecified: Secondary | ICD-10-CM | POA: Diagnosis not present

## 2020-05-14 DIAGNOSIS — E039 Hypothyroidism, unspecified: Secondary | ICD-10-CM | POA: Diagnosis not present

## 2020-05-14 DIAGNOSIS — Z6836 Body mass index (BMI) 36.0-36.9, adult: Secondary | ICD-10-CM | POA: Diagnosis not present

## 2020-05-14 DIAGNOSIS — I739 Peripheral vascular disease, unspecified: Secondary | ICD-10-CM | POA: Diagnosis not present

## 2020-05-14 DIAGNOSIS — Z008 Encounter for other general examination: Secondary | ICD-10-CM | POA: Diagnosis not present

## 2020-05-14 DIAGNOSIS — I10 Essential (primary) hypertension: Secondary | ICD-10-CM | POA: Diagnosis not present

## 2020-05-14 DIAGNOSIS — C61 Malignant neoplasm of prostate: Secondary | ICD-10-CM | POA: Diagnosis not present

## 2020-05-14 DIAGNOSIS — N393 Stress incontinence (female) (male): Secondary | ICD-10-CM | POA: Diagnosis not present

## 2020-05-14 DIAGNOSIS — N529 Male erectile dysfunction, unspecified: Secondary | ICD-10-CM | POA: Diagnosis not present

## 2020-06-04 DIAGNOSIS — E782 Mixed hyperlipidemia: Secondary | ICD-10-CM | POA: Diagnosis not present

## 2020-06-04 DIAGNOSIS — E039 Hypothyroidism, unspecified: Secondary | ICD-10-CM | POA: Diagnosis not present

## 2020-06-04 DIAGNOSIS — C61 Malignant neoplasm of prostate: Secondary | ICD-10-CM | POA: Diagnosis not present

## 2020-06-04 DIAGNOSIS — I10 Essential (primary) hypertension: Secondary | ICD-10-CM | POA: Diagnosis not present

## 2020-06-04 DIAGNOSIS — N183 Chronic kidney disease, stage 3 unspecified: Secondary | ICD-10-CM | POA: Diagnosis not present

## 2020-08-05 DIAGNOSIS — C61 Malignant neoplasm of prostate: Secondary | ICD-10-CM | POA: Diagnosis not present

## 2020-08-12 DIAGNOSIS — C61 Malignant neoplasm of prostate: Secondary | ICD-10-CM | POA: Diagnosis not present

## 2020-08-25 DIAGNOSIS — E039 Hypothyroidism, unspecified: Secondary | ICD-10-CM | POA: Diagnosis not present

## 2020-08-25 DIAGNOSIS — E782 Mixed hyperlipidemia: Secondary | ICD-10-CM | POA: Diagnosis not present

## 2020-08-25 DIAGNOSIS — I10 Essential (primary) hypertension: Secondary | ICD-10-CM | POA: Diagnosis not present

## 2020-08-25 DIAGNOSIS — R7303 Prediabetes: Secondary | ICD-10-CM | POA: Diagnosis not present

## 2020-08-25 DIAGNOSIS — Z23 Encounter for immunization: Secondary | ICD-10-CM | POA: Diagnosis not present

## 2020-08-25 DIAGNOSIS — J309 Allergic rhinitis, unspecified: Secondary | ICD-10-CM | POA: Diagnosis not present

## 2020-08-25 DIAGNOSIS — C61 Malignant neoplasm of prostate: Secondary | ICD-10-CM | POA: Diagnosis not present

## 2020-11-12 DIAGNOSIS — C61 Malignant neoplasm of prostate: Secondary | ICD-10-CM | POA: Diagnosis not present

## 2021-06-16 DIAGNOSIS — E782 Mixed hyperlipidemia: Secondary | ICD-10-CM | POA: Diagnosis not present

## 2021-06-16 DIAGNOSIS — I10 Essential (primary) hypertension: Secondary | ICD-10-CM | POA: Diagnosis not present

## 2021-06-16 DIAGNOSIS — N183 Chronic kidney disease, stage 3 unspecified: Secondary | ICD-10-CM | POA: Diagnosis not present

## 2021-06-16 DIAGNOSIS — E039 Hypothyroidism, unspecified: Secondary | ICD-10-CM | POA: Diagnosis not present

## 2021-08-24 DIAGNOSIS — I10 Essential (primary) hypertension: Secondary | ICD-10-CM | POA: Diagnosis not present

## 2021-08-24 DIAGNOSIS — R945 Abnormal results of liver function studies: Secondary | ICD-10-CM | POA: Diagnosis not present

## 2021-08-24 DIAGNOSIS — R7303 Prediabetes: Secondary | ICD-10-CM | POA: Diagnosis not present

## 2021-08-24 DIAGNOSIS — J309 Allergic rhinitis, unspecified: Secondary | ICD-10-CM | POA: Diagnosis not present

## 2021-08-24 DIAGNOSIS — E782 Mixed hyperlipidemia: Secondary | ICD-10-CM | POA: Diagnosis not present

## 2021-08-24 DIAGNOSIS — Z23 Encounter for immunization: Secondary | ICD-10-CM | POA: Diagnosis not present

## 2021-08-24 DIAGNOSIS — H9193 Unspecified hearing loss, bilateral: Secondary | ICD-10-CM | POA: Diagnosis not present

## 2021-08-24 DIAGNOSIS — E039 Hypothyroidism, unspecified: Secondary | ICD-10-CM | POA: Diagnosis not present

## 2021-09-01 DIAGNOSIS — R945 Abnormal results of liver function studies: Secondary | ICD-10-CM | POA: Diagnosis not present

## 2021-09-06 ENCOUNTER — Other Ambulatory Visit: Payer: Self-pay | Admitting: Family Medicine

## 2021-09-06 DIAGNOSIS — R7989 Other specified abnormal findings of blood chemistry: Secondary | ICD-10-CM

## 2021-09-20 ENCOUNTER — Ambulatory Visit
Admission: RE | Admit: 2021-09-20 | Discharge: 2021-09-20 | Disposition: A | Discharge status: Home / Self Care | Payer: Self-pay | Source: Ambulatory Visit | Attending: Family Medicine | Admitting: Family Medicine

## 2021-09-20 DIAGNOSIS — R7989 Other specified abnormal findings of blood chemistry: Secondary | ICD-10-CM | POA: Diagnosis not present

## 2021-10-04 DIAGNOSIS — E782 Mixed hyperlipidemia: Secondary | ICD-10-CM | POA: Diagnosis not present

## 2021-10-04 DIAGNOSIS — I10 Essential (primary) hypertension: Secondary | ICD-10-CM | POA: Diagnosis not present

## 2021-10-04 DIAGNOSIS — E039 Hypothyroidism, unspecified: Secondary | ICD-10-CM | POA: Diagnosis not present

## 2021-10-04 DIAGNOSIS — K760 Fatty (change of) liver, not elsewhere classified: Secondary | ICD-10-CM | POA: Diagnosis not present

## 2021-10-04 DIAGNOSIS — E1169 Type 2 diabetes mellitus with other specified complication: Secondary | ICD-10-CM | POA: Diagnosis not present

## 2021-11-02 DIAGNOSIS — S83281A Other tear of lateral meniscus, current injury, right knee, initial encounter: Secondary | ICD-10-CM | POA: Diagnosis not present

## 2021-11-25 DIAGNOSIS — E039 Hypothyroidism, unspecified: Secondary | ICD-10-CM | POA: Diagnosis not present

## 2022-04-14 DIAGNOSIS — I10 Essential (primary) hypertension: Secondary | ICD-10-CM | POA: Diagnosis not present

## 2022-04-14 DIAGNOSIS — Z1211 Encounter for screening for malignant neoplasm of colon: Secondary | ICD-10-CM | POA: Diagnosis not present

## 2022-04-14 DIAGNOSIS — E782 Mixed hyperlipidemia: Secondary | ICD-10-CM | POA: Diagnosis not present

## 2022-04-14 DIAGNOSIS — E039 Hypothyroidism, unspecified: Secondary | ICD-10-CM | POA: Diagnosis not present

## 2022-04-14 DIAGNOSIS — E1169 Type 2 diabetes mellitus with other specified complication: Secondary | ICD-10-CM | POA: Diagnosis not present

## 2022-04-14 DIAGNOSIS — K760 Fatty (change of) liver, not elsewhere classified: Secondary | ICD-10-CM | POA: Diagnosis not present

## 2022-04-14 DIAGNOSIS — Z0000 Encounter for general adult medical examination without abnormal findings: Secondary | ICD-10-CM | POA: Diagnosis not present

## 2022-05-13 ENCOUNTER — Other Ambulatory Visit (HOSPITAL_COMMUNITY): Payer: Self-pay | Admitting: Urology

## 2022-05-13 DIAGNOSIS — C61 Malignant neoplasm of prostate: Secondary | ICD-10-CM

## 2022-05-19 ENCOUNTER — Encounter (HOSPITAL_COMMUNITY)
Admission: RE | Admit: 2022-05-19 | Discharge: 2022-05-19 | Disposition: A | Discharge status: Home / Self Care | Payer: Medicare Other | Source: Ambulatory Visit | Attending: Urology | Admitting: Urology

## 2022-05-19 DIAGNOSIS — I2510 Atherosclerotic heart disease of native coronary artery without angina pectoris: Secondary | ICD-10-CM | POA: Diagnosis not present

## 2022-05-19 DIAGNOSIS — C61 Malignant neoplasm of prostate: Secondary | ICD-10-CM | POA: Diagnosis present

## 2022-05-19 DIAGNOSIS — I700 Atherosclerosis of aorta: Secondary | ICD-10-CM | POA: Diagnosis not present

## 2022-05-19 DIAGNOSIS — K760 Fatty (change of) liver, not elsewhere classified: Secondary | ICD-10-CM | POA: Diagnosis not present

## 2022-05-19 MED ORDER — PIFLIFOLASTAT F 18 (PYLARIFY) INJECTION
9.0000 | Freq: Once | INTRAVENOUS | Status: AC
  Administered 2022-05-19: 9.04 via INTRAVENOUS

## 2022-06-01 DIAGNOSIS — Z5111 Encounter for antineoplastic chemotherapy: Secondary | ICD-10-CM | POA: Diagnosis not present

## 2022-10-06 DIAGNOSIS — R0609 Other forms of dyspnea: Secondary | ICD-10-CM | POA: Diagnosis not present

## 2022-10-06 DIAGNOSIS — I10 Essential (primary) hypertension: Secondary | ICD-10-CM | POA: Diagnosis not present

## 2022-10-06 DIAGNOSIS — K760 Fatty (change of) liver, not elsewhere classified: Secondary | ICD-10-CM | POA: Diagnosis not present

## 2022-10-06 DIAGNOSIS — C799 Secondary malignant neoplasm of unspecified site: Secondary | ICD-10-CM | POA: Diagnosis not present

## 2022-10-06 DIAGNOSIS — E1169 Type 2 diabetes mellitus with other specified complication: Secondary | ICD-10-CM | POA: Diagnosis not present

## 2022-10-06 DIAGNOSIS — E039 Hypothyroidism, unspecified: Secondary | ICD-10-CM | POA: Diagnosis not present

## 2022-10-06 DIAGNOSIS — E782 Mixed hyperlipidemia: Secondary | ICD-10-CM | POA: Diagnosis not present

## 2022-10-06 DIAGNOSIS — Z23 Encounter for immunization: Secondary | ICD-10-CM | POA: Diagnosis not present

## 2023-04-23 IMAGING — CT NM PET TUM IMG SKULL BASE T - THIGH
1 of 7 series · 1 of 25 positions shown · non-contrast
Comparison: CT of the abdomen and pelvis dated January 04, 2018.

CLINICAL DATA: Prostate carcinoma with biochemical recurrence.

EXAM:
NUCLEAR MEDICINE PET SKULL BASE TO THIGH
TECHNIQUE: 9.04 mCi F18 Piflufolastat (Pylarify) was injected intravenously.
Full-ring PET imaging was performed from the skull base to thigh
after the radiotracer. CT data was obtained and used for attenuation
correction and anatomic localization.

[Series 3: pet sk_thigh ac · axial · 5.0mm · 4.07mm/px · 1 of 275 slices shown]
[im 165/275]
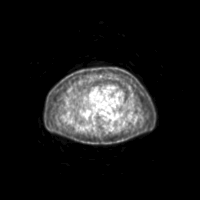

[1 of 25 positions shown; findings below may reference images not displayed]

FINDINGS: NECK

No radiotracer activity in neck lymph nodes.

Incidental CT finding: None

CHEST

No radiotracer accumulation within mediastinal or hilar lymph nodes.
No suspicious pulmonary nodules on the CT scan.

Incidental CT finding: Aortic atherosclerosis. Signs of coronary
artery disease. No pericardial effusion. Normal caliber of the
thoracic aorta and central pulmonary vessels. Limited assessment of
cardiovascular structures given lack of intravenous contrast. Signs
of prior granulomatous disease in the chest. Airways are patent.

ABDOMEN/PELVIS

Prostate: No focal activity in the prostate bed.

Lymph nodes: No abnormal radiotracer accumulation within pelvic or
abdominal nodes.

Liver: No evidence of liver metastasis

Incidental CT finding: Moderate hepatic steatosis with areas of
fatty sparing about the pre portal liver and gallbladder fossa. No
acute findings relative to liver, gallbladder, pancreas, spleen,
adrenal glands or the kidneys.

Aortic atherosclerosis.  No adenopathy by size criteria.

Focal area of radiotracer accumulation in the mid abdomen to the
RIGHT of the midline favored to be related to small bowel in this
location with other areas of similar small bowel uptake in the more
proximal small bowel. This is just deep to the abdominal wall in
there is no visible corresponding lesion in this location.

SKELETON

No focal  activity to suggest skeletal metastasis.
IMPRESSION: Signs of prostatectomy.  No signs of disease recurrence by PSMA.

Aortic atherosclerosis.

Coronary artery disease.

Hepatic steatosis.

Aortic Atherosclerosis ([id number]-S16.6).

## 2023-05-22 DIAGNOSIS — E1169 Type 2 diabetes mellitus with other specified complication: Secondary | ICD-10-CM | POA: Diagnosis not present

## 2023-05-22 DIAGNOSIS — C799 Secondary malignant neoplasm of unspecified site: Secondary | ICD-10-CM | POA: Diagnosis not present

## 2023-05-22 DIAGNOSIS — E119 Type 2 diabetes mellitus without complications: Secondary | ICD-10-CM | POA: Diagnosis not present

## 2023-05-22 DIAGNOSIS — K760 Fatty (change of) liver, not elsewhere classified: Secondary | ICD-10-CM | POA: Diagnosis not present

## 2023-05-22 DIAGNOSIS — Z0000 Encounter for general adult medical examination without abnormal findings: Secondary | ICD-10-CM | POA: Diagnosis not present

## 2023-05-22 DIAGNOSIS — Z23 Encounter for immunization: Secondary | ICD-10-CM | POA: Diagnosis not present

## 2023-05-22 DIAGNOSIS — E782 Mixed hyperlipidemia: Secondary | ICD-10-CM | POA: Diagnosis not present

## 2023-05-22 DIAGNOSIS — E039 Hypothyroidism, unspecified: Secondary | ICD-10-CM | POA: Diagnosis not present

## 2023-07-27 DIAGNOSIS — Z5111 Encounter for antineoplastic chemotherapy: Secondary | ICD-10-CM | POA: Diagnosis not present

## 2023-10-09 DIAGNOSIS — E119 Type 2 diabetes mellitus without complications: Secondary | ICD-10-CM | POA: Diagnosis not present

## 2023-10-09 DIAGNOSIS — C799 Secondary malignant neoplasm of unspecified site: Secondary | ICD-10-CM | POA: Diagnosis not present

## 2023-10-09 DIAGNOSIS — E1169 Type 2 diabetes mellitus with other specified complication: Secondary | ICD-10-CM | POA: Diagnosis not present

## 2023-10-09 DIAGNOSIS — M545 Low back pain, unspecified: Secondary | ICD-10-CM | POA: Diagnosis not present

## 2023-10-09 DIAGNOSIS — I10 Essential (primary) hypertension: Secondary | ICD-10-CM | POA: Diagnosis not present

## 2023-10-09 DIAGNOSIS — Z23 Encounter for immunization: Secondary | ICD-10-CM | POA: Diagnosis not present

## 2023-10-09 DIAGNOSIS — K760 Fatty (change of) liver, not elsewhere classified: Secondary | ICD-10-CM | POA: Diagnosis not present

## 2023-10-09 DIAGNOSIS — E039 Hypothyroidism, unspecified: Secondary | ICD-10-CM | POA: Diagnosis not present

## 2023-10-09 DIAGNOSIS — E782 Mixed hyperlipidemia: Secondary | ICD-10-CM | POA: Diagnosis not present

## 2024-05-04 DIAGNOSIS — E782 Mixed hyperlipidemia: Secondary | ICD-10-CM | POA: Diagnosis not present

## 2024-05-04 DIAGNOSIS — E1169 Type 2 diabetes mellitus with other specified complication: Secondary | ICD-10-CM | POA: Diagnosis not present

## 2024-05-04 DIAGNOSIS — E039 Hypothyroidism, unspecified: Secondary | ICD-10-CM | POA: Diagnosis not present

## 2024-05-04 DIAGNOSIS — I10 Essential (primary) hypertension: Secondary | ICD-10-CM | POA: Diagnosis not present

## 2024-06-03 DIAGNOSIS — E1169 Type 2 diabetes mellitus with other specified complication: Secondary | ICD-10-CM | POA: Diagnosis not present

## 2024-06-03 DIAGNOSIS — E782 Mixed hyperlipidemia: Secondary | ICD-10-CM | POA: Diagnosis not present

## 2024-06-03 DIAGNOSIS — I10 Essential (primary) hypertension: Secondary | ICD-10-CM | POA: Diagnosis not present

## 2024-06-03 DIAGNOSIS — E039 Hypothyroidism, unspecified: Secondary | ICD-10-CM | POA: Diagnosis not present

## 2024-06-20 DIAGNOSIS — E1169 Type 2 diabetes mellitus with other specified complication: Secondary | ICD-10-CM | POA: Diagnosis not present

## 2024-06-20 DIAGNOSIS — Z0000 Encounter for general adult medical examination without abnormal findings: Secondary | ICD-10-CM | POA: Diagnosis not present

## 2024-06-20 DIAGNOSIS — I10 Essential (primary) hypertension: Secondary | ICD-10-CM | POA: Diagnosis not present

## 2024-06-20 DIAGNOSIS — K760 Fatty (change of) liver, not elsewhere classified: Secondary | ICD-10-CM | POA: Diagnosis not present

## 2024-06-20 DIAGNOSIS — C799 Secondary malignant neoplasm of unspecified site: Secondary | ICD-10-CM | POA: Diagnosis not present

## 2024-06-20 DIAGNOSIS — E782 Mixed hyperlipidemia: Secondary | ICD-10-CM | POA: Diagnosis not present

## 2024-06-20 DIAGNOSIS — E039 Hypothyroidism, unspecified: Secondary | ICD-10-CM | POA: Diagnosis not present

## 2024-07-04 DIAGNOSIS — I10 Essential (primary) hypertension: Secondary | ICD-10-CM | POA: Diagnosis not present

## 2024-07-04 DIAGNOSIS — E782 Mixed hyperlipidemia: Secondary | ICD-10-CM | POA: Diagnosis not present

## 2024-07-04 DIAGNOSIS — E1169 Type 2 diabetes mellitus with other specified complication: Secondary | ICD-10-CM | POA: Diagnosis not present

## 2024-07-04 DIAGNOSIS — E039 Hypothyroidism, unspecified: Secondary | ICD-10-CM | POA: Diagnosis not present

## 2024-08-04 DIAGNOSIS — E1169 Type 2 diabetes mellitus with other specified complication: Secondary | ICD-10-CM | POA: Diagnosis not present

## 2024-08-04 DIAGNOSIS — E039 Hypothyroidism, unspecified: Secondary | ICD-10-CM | POA: Diagnosis not present

## 2024-08-04 DIAGNOSIS — E782 Mixed hyperlipidemia: Secondary | ICD-10-CM | POA: Diagnosis not present

## 2024-08-04 DIAGNOSIS — I10 Essential (primary) hypertension: Secondary | ICD-10-CM | POA: Diagnosis not present

## 2024-09-03 DIAGNOSIS — E039 Hypothyroidism, unspecified: Secondary | ICD-10-CM | POA: Diagnosis not present

## 2024-09-03 DIAGNOSIS — E1169 Type 2 diabetes mellitus with other specified complication: Secondary | ICD-10-CM | POA: Diagnosis not present

## 2024-09-03 DIAGNOSIS — E782 Mixed hyperlipidemia: Secondary | ICD-10-CM | POA: Diagnosis not present

## 2024-09-03 DIAGNOSIS — I10 Essential (primary) hypertension: Secondary | ICD-10-CM | POA: Diagnosis not present
